# Patient Record
Sex: Female | Born: 1950
Health system: Southern US, Community
[De-identification: ages and names within clinical notes are randomized; demographics above are authoritative.]

## PROBLEM LIST (undated history)

## (undated) DIAGNOSIS — E119 Type 2 diabetes mellitus without complications: Secondary | ICD-10-CM

## (undated) DIAGNOSIS — I10 Essential (primary) hypertension: Secondary | ICD-10-CM

## (undated) DIAGNOSIS — Z87442 Personal history of urinary calculi: Secondary | ICD-10-CM

## (undated) HISTORY — PX: BREAST EXCISIONAL BIOPSY: SUR124

## (undated) HISTORY — PX: TONSILLECTOMY: SUR1361

## (undated) HISTORY — PX: EYE SURGERY: SHX253

---

## 2006-08-06 ENCOUNTER — Other Ambulatory Visit: Admission: RE | Admit: 2006-08-06 | Discharge: 2006-08-06 | Payer: Self-pay | Admitting: Family Medicine

## 2006-08-17 ENCOUNTER — Encounter: Admission: RE | Admit: 2006-08-17 | Discharge: 2006-08-17 | Payer: Self-pay | Admitting: Family Medicine

## 2007-08-24 ENCOUNTER — Encounter: Admission: RE | Admit: 2007-08-24 | Discharge: 2007-08-24 | Payer: Self-pay | Admitting: Family Medicine

## 2008-03-08 ENCOUNTER — Ambulatory Visit (HOSPITAL_BASED_OUTPATIENT_CLINIC_OR_DEPARTMENT_OTHER): Admission: RE | Admit: 2008-03-08 | Discharge: 2008-03-08 | Payer: Self-pay | Admitting: Urology

## 2008-08-24 ENCOUNTER — Encounter: Admission: RE | Admit: 2008-08-24 | Discharge: 2008-08-24 | Payer: Self-pay | Admitting: Family Medicine

## 2009-06-24 ENCOUNTER — Other Ambulatory Visit: Admission: RE | Admit: 2009-06-24 | Discharge: 2009-06-24 | Payer: Self-pay | Admitting: Family Medicine

## 2009-07-08 ENCOUNTER — Encounter: Admission: RE | Admit: 2009-07-08 | Discharge: 2009-07-08 | Payer: Self-pay | Admitting: Family Medicine

## 2009-08-28 ENCOUNTER — Encounter: Admission: RE | Admit: 2009-08-28 | Discharge: 2009-08-28 | Payer: Self-pay | Admitting: Family Medicine

## 2010-08-29 ENCOUNTER — Encounter
Admission: RE | Admit: 2010-08-29 | Discharge: 2010-08-29 | Payer: Self-pay | Source: Home / Self Care | Attending: Family Medicine | Admitting: Family Medicine

## 2010-12-23 NOTE — Op Note (Signed)
Kristy Montgomery, Kristy Montgomery                ACCOUNT NO.:  [id number]   MEDICAL RECORD NO.:  [id number]          PATIENT TYPE:  AMB   LOCATION:  [facility]                         FACILITY:  [facility]   PHYSICIAN:  Sigmund I. Patsi Sears, M.D.DATE OF BIRTH:  10-Jul-1951   DATE OF PROCEDURE:  03/08/2008  DATE OF DISCHARGE:                               OPERATIVE REPORT   PREOPERATIVE DIAGNOSIS:  Stress urinary incontinence.   POSTOPERATIVE DIAGNOSIS:  Stress urinary incontinence.   OPERATION:  Solyx transvaginal pubovaginal sling.   SURGEON:  Sigmund I. Patsi Sears, M.D.   ANESTHESIA:  General LMA.   PREPARATION:  After appropriate preanesthesia, the patient was brought  to the operating room and placed on the operating table in the dorsal  supine positive where general LMA anesthesia was introduced.  She was re-  placed in the dorsal lithotomy position where the pubis was prepped with  Betadine solution and draped in the usual fashion.   HISTORY:  This 60 year old female is post hysterectomy, complaints of  stress urinary incontinence.  She has a positive Q-tip test, positive  Marshall test, and no evidence of pelvic floor descent.  She presents  now for Solyx transvaginal sling.   DESCRIPTION OF PROCEDURE:  Vaginal inspection reveals the patient has  normal vaginal epithelium.  A posterior weighted speculum is placed, and  a Foley catheter is placed.  The bladder neck is easily palpable, and  Marcaine 0.25% with epinephrine 1:200,000 is injected in the  periurethral space.  A 1.5-cm incision is made in the midline of the  urethra, in the location of the mid urethra.  Subcutaneous tissue is  dissected, and using a marking pen, the obturator fossa is outlined.  Using the Solyx sling mesh, marking is placed halfway on the sling, and  the sling is first placed in the right obturator internus muscle without  difficulty.  It is placed at a 45-degree angle to the urethra, toward  the mark in the  obturator fossa, with more than half the sling placed in  this fashion.  The second portion of the sling is then placed in the  left obturator internus muscle, and visual inspection is accomplished.  After antibiotic irrigation, it shows that there is excellent  tensioning.  I am able to place a right-angle clamp behind the sling,  but the sling shows periurethral tissue pushing through the sling pores.  Following this, the wound is closed with running and interrupted 2-0  Vicryl suture.  Bleeding appeared from the vaginal epithelial edge and  stopped with suturing.  The patient tolerated the procedure well.  She  received a B&O suppository, as well as IV Toradol at the end of the  procedure.  She was awakened and taken to the recovery room in good  condition.      Sigmund I. Patsi Sears, M.D.  Electronically Signed    SIT/MEDQ  D:  03/08/2008  T:  03/08/2008  Job:  [id number]

## 2011-05-08 LAB — POCT I-STAT 4, (NA,K, GLUC, HGB,HCT)
HCT: 47 — ABNORMAL HIGH
Potassium: 4.1

## 2011-08-19 ENCOUNTER — Other Ambulatory Visit: Payer: Self-pay | Admitting: Family Medicine

## 2011-08-19 DIAGNOSIS — Z1231 Encounter for screening mammogram for malignant neoplasm of breast: Secondary | ICD-10-CM

## 2011-09-02 ENCOUNTER — Ambulatory Visit
Admission: RE | Admit: 2011-09-02 | Discharge: 2011-09-02 | Disposition: A | Discharge status: Home / Self Care | Payer: BC Managed Care – PPO | Source: Ambulatory Visit | Attending: Family Medicine | Admitting: Family Medicine

## 2011-09-02 DIAGNOSIS — Z1231 Encounter for screening mammogram for malignant neoplasm of breast: Secondary | ICD-10-CM

## 2012-08-09 ENCOUNTER — Other Ambulatory Visit: Payer: Self-pay | Admitting: Family Medicine

## 2012-08-09 DIAGNOSIS — Z1231 Encounter for screening mammogram for malignant neoplasm of breast: Secondary | ICD-10-CM

## 2012-09-13 ENCOUNTER — Ambulatory Visit
Admission: RE | Admit: 2012-09-13 | Discharge: 2012-09-13 | Disposition: A | Discharge status: Home / Self Care | Payer: BC Managed Care – PPO | Source: Ambulatory Visit | Attending: Family Medicine | Admitting: Family Medicine

## 2012-09-13 DIAGNOSIS — Z1231 Encounter for screening mammogram for malignant neoplasm of breast: Secondary | ICD-10-CM

## 2013-08-31 ENCOUNTER — Other Ambulatory Visit: Payer: Self-pay

## 2013-08-31 DIAGNOSIS — Z1231 Encounter for screening mammogram for malignant neoplasm of breast: Secondary | ICD-10-CM

## 2013-09-22 ENCOUNTER — Ambulatory Visit
Admission: RE | Admit: 2013-09-22 | Discharge: 2013-09-22 | Disposition: A | Discharge status: Home / Self Care | Payer: BC Managed Care – PPO | Source: Ambulatory Visit

## 2013-09-22 DIAGNOSIS — Z1231 Encounter for screening mammogram for malignant neoplasm of breast: Secondary | ICD-10-CM

## 2014-04-24 ENCOUNTER — Ambulatory Visit
Admission: RE | Admit: 2014-04-24 | Discharge: 2014-04-24 | Disposition: A | Discharge status: Home / Self Care | Payer: BC Managed Care – PPO | Source: Ambulatory Visit | Attending: Family Medicine | Admitting: Family Medicine

## 2014-04-24 ENCOUNTER — Other Ambulatory Visit: Payer: Self-pay | Admitting: Family Medicine

## 2014-04-24 DIAGNOSIS — R071 Chest pain on breathing: Secondary | ICD-10-CM

## 2014-09-03 ENCOUNTER — Other Ambulatory Visit: Payer: Self-pay

## 2014-09-03 DIAGNOSIS — Z1231 Encounter for screening mammogram for malignant neoplasm of breast: Secondary | ICD-10-CM

## 2014-09-24 ENCOUNTER — Ambulatory Visit: Payer: Self-pay

## 2014-10-04 ENCOUNTER — Ambulatory Visit
Admission: RE | Admit: 2014-10-04 | Discharge: 2014-10-04 | Disposition: A | Discharge status: Home / Self Care | Payer: 59 | Source: Ambulatory Visit

## 2014-10-04 DIAGNOSIS — Z1231 Encounter for screening mammogram for malignant neoplasm of breast: Secondary | ICD-10-CM

## 2015-08-30 ENCOUNTER — Other Ambulatory Visit: Payer: Self-pay

## 2015-08-30 DIAGNOSIS — Z1231 Encounter for screening mammogram for malignant neoplasm of breast: Secondary | ICD-10-CM

## 2015-09-30 ENCOUNTER — Other Ambulatory Visit: Payer: Self-pay | Admitting: Family Medicine

## 2015-09-30 DIAGNOSIS — N951 Menopausal and female climacteric states: Secondary | ICD-10-CM

## 2015-10-07 ENCOUNTER — Ambulatory Visit
Admission: RE | Admit: 2015-10-07 | Discharge: 2015-10-07 | Disposition: A | Discharge status: Home / Self Care | Payer: BLUE CROSS/BLUE SHIELD | Source: Ambulatory Visit

## 2015-10-07 DIAGNOSIS — Z1231 Encounter for screening mammogram for malignant neoplasm of breast: Secondary | ICD-10-CM

## 2015-10-18 ENCOUNTER — Other Ambulatory Visit: Payer: Self-pay

## 2015-10-31 ENCOUNTER — Ambulatory Visit
Admission: RE | Admit: 2015-10-31 | Discharge: 2015-10-31 | Disposition: A | Discharge status: Home / Self Care | Payer: BLUE CROSS/BLUE SHIELD | Source: Ambulatory Visit | Attending: Family Medicine | Admitting: Family Medicine

## 2015-10-31 DIAGNOSIS — N951 Menopausal and female climacteric states: Secondary | ICD-10-CM

## 2016-09-16 ENCOUNTER — Other Ambulatory Visit: Payer: Self-pay | Admitting: Family Medicine

## 2016-09-16 DIAGNOSIS — Z1231 Encounter for screening mammogram for malignant neoplasm of breast: Secondary | ICD-10-CM

## 2016-10-08 ENCOUNTER — Ambulatory Visit
Admission: RE | Admit: 2016-10-08 | Discharge: 2016-10-08 | Disposition: A | Discharge status: Home / Self Care | Payer: BLUE CROSS/BLUE SHIELD | Source: Ambulatory Visit | Attending: Family Medicine | Admitting: Family Medicine

## 2016-10-08 DIAGNOSIS — Z1231 Encounter for screening mammogram for malignant neoplasm of breast: Secondary | ICD-10-CM

## 2017-09-07 ENCOUNTER — Other Ambulatory Visit: Payer: Self-pay | Admitting: Family Medicine

## 2017-09-07 DIAGNOSIS — Z139 Encounter for screening, unspecified: Secondary | ICD-10-CM

## 2017-10-11 ENCOUNTER — Ambulatory Visit
Admission: RE | Admit: 2017-10-11 | Discharge: 2017-10-11 | Disposition: A | Discharge status: Home / Self Care | Payer: Self-pay | Source: Ambulatory Visit | Attending: Family Medicine | Admitting: Family Medicine

## 2017-10-11 DIAGNOSIS — Z139 Encounter for screening, unspecified: Secondary | ICD-10-CM

## 2017-10-11 DIAGNOSIS — Z1231 Encounter for screening mammogram for malignant neoplasm of breast: Secondary | ICD-10-CM | POA: Diagnosis not present

## 2017-10-21 DIAGNOSIS — J0110 Acute frontal sinusitis, unspecified: Secondary | ICD-10-CM | POA: Diagnosis not present

## 2017-10-21 DIAGNOSIS — E785 Hyperlipidemia, unspecified: Secondary | ICD-10-CM | POA: Diagnosis not present

## 2017-10-21 DIAGNOSIS — E1121 Type 2 diabetes mellitus with diabetic nephropathy: Secondary | ICD-10-CM | POA: Diagnosis not present

## 2017-10-21 DIAGNOSIS — N181 Chronic kidney disease, stage 1: Secondary | ICD-10-CM | POA: Diagnosis not present

## 2017-10-21 DIAGNOSIS — I129 Hypertensive chronic kidney disease with stage 1 through stage 4 chronic kidney disease, or unspecified chronic kidney disease: Secondary | ICD-10-CM | POA: Diagnosis not present

## 2017-10-28 DIAGNOSIS — Z23 Encounter for immunization: Secondary | ICD-10-CM | POA: Diagnosis not present

## 2017-12-23 DIAGNOSIS — H401132 Primary open-angle glaucoma, bilateral, moderate stage: Secondary | ICD-10-CM | POA: Diagnosis not present

## 2017-12-23 DIAGNOSIS — H04123 Dry eye syndrome of bilateral lacrimal glands: Secondary | ICD-10-CM | POA: Diagnosis not present

## 2018-01-21 DIAGNOSIS — I129 Hypertensive chronic kidney disease with stage 1 through stage 4 chronic kidney disease, or unspecified chronic kidney disease: Secondary | ICD-10-CM | POA: Diagnosis not present

## 2018-01-21 DIAGNOSIS — E785 Hyperlipidemia, unspecified: Secondary | ICD-10-CM | POA: Diagnosis not present

## 2018-01-21 DIAGNOSIS — E1121 Type 2 diabetes mellitus with diabetic nephropathy: Secondary | ICD-10-CM | POA: Diagnosis not present

## 2018-01-30 DIAGNOSIS — M545 Low back pain: Secondary | ICD-10-CM | POA: Diagnosis not present

## 2018-05-27 DIAGNOSIS — Z0000 Encounter for general adult medical examination without abnormal findings: Secondary | ICD-10-CM | POA: Diagnosis not present

## 2018-05-27 DIAGNOSIS — Z23 Encounter for immunization: Secondary | ICD-10-CM | POA: Diagnosis not present

## 2018-05-27 DIAGNOSIS — E559 Vitamin D deficiency, unspecified: Secondary | ICD-10-CM | POA: Diagnosis not present

## 2018-05-27 DIAGNOSIS — R809 Proteinuria, unspecified: Secondary | ICD-10-CM | POA: Diagnosis not present

## 2018-05-27 DIAGNOSIS — I129 Hypertensive chronic kidney disease with stage 1 through stage 4 chronic kidney disease, or unspecified chronic kidney disease: Secondary | ICD-10-CM | POA: Diagnosis not present

## 2018-05-27 DIAGNOSIS — E1121 Type 2 diabetes mellitus with diabetic nephropathy: Secondary | ICD-10-CM | POA: Diagnosis not present

## 2018-05-27 DIAGNOSIS — E785 Hyperlipidemia, unspecified: Secondary | ICD-10-CM | POA: Diagnosis not present

## 2018-05-27 DIAGNOSIS — N181 Chronic kidney disease, stage 1: Secondary | ICD-10-CM | POA: Diagnosis not present

## 2018-06-28 DIAGNOSIS — H401132 Primary open-angle glaucoma, bilateral, moderate stage: Secondary | ICD-10-CM | POA: Diagnosis not present

## 2018-06-28 DIAGNOSIS — H35371 Puckering of macula, right eye: Secondary | ICD-10-CM | POA: Diagnosis not present

## 2018-06-28 DIAGNOSIS — H04123 Dry eye syndrome of bilateral lacrimal glands: Secondary | ICD-10-CM | POA: Diagnosis not present

## 2018-07-10 DIAGNOSIS — R079 Chest pain, unspecified: Secondary | ICD-10-CM | POA: Diagnosis not present

## 2018-09-05 ENCOUNTER — Other Ambulatory Visit: Payer: Self-pay | Admitting: Family Medicine

## 2018-09-05 DIAGNOSIS — Z1231 Encounter for screening mammogram for malignant neoplasm of breast: Secondary | ICD-10-CM

## 2018-09-30 DIAGNOSIS — E1129 Type 2 diabetes mellitus with other diabetic kidney complication: Secondary | ICD-10-CM | POA: Diagnosis not present

## 2018-09-30 DIAGNOSIS — R809 Proteinuria, unspecified: Secondary | ICD-10-CM | POA: Diagnosis not present

## 2018-09-30 DIAGNOSIS — I129 Hypertensive chronic kidney disease with stage 1 through stage 4 chronic kidney disease, or unspecified chronic kidney disease: Secondary | ICD-10-CM | POA: Diagnosis not present

## 2018-09-30 DIAGNOSIS — N2581 Secondary hyperparathyroidism of renal origin: Secondary | ICD-10-CM | POA: Diagnosis not present

## 2018-10-04 ENCOUNTER — Other Ambulatory Visit: Payer: Self-pay | Admitting: Nephrology

## 2018-10-04 DIAGNOSIS — I129 Hypertensive chronic kidney disease with stage 1 through stage 4 chronic kidney disease, or unspecified chronic kidney disease: Secondary | ICD-10-CM

## 2018-10-04 DIAGNOSIS — R809 Proteinuria, unspecified: Secondary | ICD-10-CM

## 2018-10-07 ENCOUNTER — Ambulatory Visit
Admission: RE | Admit: 2018-10-07 | Discharge: 2018-10-07 | Disposition: A | Discharge status: Home / Self Care | Payer: PPO | Source: Ambulatory Visit | Attending: Nephrology | Admitting: Nephrology

## 2018-10-07 DIAGNOSIS — I129 Hypertensive chronic kidney disease with stage 1 through stage 4 chronic kidney disease, or unspecified chronic kidney disease: Secondary | ICD-10-CM

## 2018-10-07 DIAGNOSIS — N200 Calculus of kidney: Secondary | ICD-10-CM | POA: Diagnosis not present

## 2018-10-07 DIAGNOSIS — R809 Proteinuria, unspecified: Secondary | ICD-10-CM

## 2018-10-17 ENCOUNTER — Ambulatory Visit
Admission: RE | Admit: 2018-10-17 | Discharge: 2018-10-17 | Disposition: A | Discharge status: Home / Self Care | Payer: PPO | Source: Ambulatory Visit | Attending: Family Medicine | Admitting: Family Medicine

## 2018-10-17 DIAGNOSIS — Z1231 Encounter for screening mammogram for malignant neoplasm of breast: Secondary | ICD-10-CM

## 2018-11-25 DIAGNOSIS — I129 Hypertensive chronic kidney disease with stage 1 through stage 4 chronic kidney disease, or unspecified chronic kidney disease: Secondary | ICD-10-CM | POA: Diagnosis not present

## 2018-11-25 DIAGNOSIS — E785 Hyperlipidemia, unspecified: Secondary | ICD-10-CM | POA: Diagnosis not present

## 2018-11-25 DIAGNOSIS — E559 Vitamin D deficiency, unspecified: Secondary | ICD-10-CM | POA: Diagnosis not present

## 2018-11-25 DIAGNOSIS — E1121 Type 2 diabetes mellitus with diabetic nephropathy: Secondary | ICD-10-CM | POA: Diagnosis not present

## 2018-11-25 DIAGNOSIS — R509 Fever, unspecified: Secondary | ICD-10-CM | POA: Diagnosis not present

## 2018-11-25 DIAGNOSIS — N181 Chronic kidney disease, stage 1: Secondary | ICD-10-CM | POA: Diagnosis not present

## 2018-12-13 DIAGNOSIS — N2581 Secondary hyperparathyroidism of renal origin: Secondary | ICD-10-CM | POA: Diagnosis not present

## 2018-12-13 DIAGNOSIS — I129 Hypertensive chronic kidney disease with stage 1 through stage 4 chronic kidney disease, or unspecified chronic kidney disease: Secondary | ICD-10-CM | POA: Diagnosis not present

## 2018-12-22 DIAGNOSIS — I129 Hypertensive chronic kidney disease with stage 1 through stage 4 chronic kidney disease, or unspecified chronic kidney disease: Secondary | ICD-10-CM | POA: Diagnosis not present

## 2018-12-22 DIAGNOSIS — N2581 Secondary hyperparathyroidism of renal origin: Secondary | ICD-10-CM | POA: Diagnosis not present

## 2018-12-22 DIAGNOSIS — R809 Proteinuria, unspecified: Secondary | ICD-10-CM | POA: Diagnosis not present

## 2019-04-18 DIAGNOSIS — I129 Hypertensive chronic kidney disease with stage 1 through stage 4 chronic kidney disease, or unspecified chronic kidney disease: Secondary | ICD-10-CM | POA: Diagnosis not present

## 2019-04-18 DIAGNOSIS — N2581 Secondary hyperparathyroidism of renal origin: Secondary | ICD-10-CM | POA: Diagnosis not present

## 2019-04-24 DIAGNOSIS — I129 Hypertensive chronic kidney disease with stage 1 through stage 4 chronic kidney disease, or unspecified chronic kidney disease: Secondary | ICD-10-CM | POA: Diagnosis not present

## 2019-04-24 DIAGNOSIS — R809 Proteinuria, unspecified: Secondary | ICD-10-CM | POA: Diagnosis not present

## 2019-04-24 DIAGNOSIS — N2581 Secondary hyperparathyroidism of renal origin: Secondary | ICD-10-CM | POA: Diagnosis not present

## 2019-04-24 DIAGNOSIS — E1129 Type 2 diabetes mellitus with other diabetic kidney complication: Secondary | ICD-10-CM | POA: Diagnosis not present

## 2019-04-26 DIAGNOSIS — Z23 Encounter for immunization: Secondary | ICD-10-CM | POA: Diagnosis not present

## 2019-06-22 DIAGNOSIS — H35371 Puckering of macula, right eye: Secondary | ICD-10-CM | POA: Diagnosis not present

## 2019-06-22 DIAGNOSIS — H401132 Primary open-angle glaucoma, bilateral, moderate stage: Secondary | ICD-10-CM | POA: Diagnosis not present

## 2019-06-22 DIAGNOSIS — H04123 Dry eye syndrome of bilateral lacrimal glands: Secondary | ICD-10-CM | POA: Diagnosis not present

## 2019-07-23 DIAGNOSIS — R8281 Pyuria: Secondary | ICD-10-CM | POA: Diagnosis not present

## 2019-07-23 DIAGNOSIS — R109 Unspecified abdominal pain: Secondary | ICD-10-CM | POA: Diagnosis not present

## 2019-07-23 DIAGNOSIS — R310 Gross hematuria: Secondary | ICD-10-CM | POA: Diagnosis not present

## 2019-09-05 DIAGNOSIS — Z7984 Long term (current) use of oral hypoglycemic drugs: Secondary | ICD-10-CM | POA: Diagnosis not present

## 2019-09-05 DIAGNOSIS — E785 Hyperlipidemia, unspecified: Secondary | ICD-10-CM | POA: Diagnosis not present

## 2019-09-05 DIAGNOSIS — R8281 Pyuria: Secondary | ICD-10-CM | POA: Diagnosis not present

## 2019-09-05 DIAGNOSIS — N200 Calculus of kidney: Secondary | ICD-10-CM | POA: Diagnosis not present

## 2019-09-05 DIAGNOSIS — R300 Dysuria: Secondary | ICD-10-CM | POA: Diagnosis not present

## 2019-09-05 DIAGNOSIS — E119 Type 2 diabetes mellitus without complications: Secondary | ICD-10-CM | POA: Diagnosis not present

## 2019-09-06 ENCOUNTER — Ambulatory Visit
Admission: RE | Admit: 2019-09-06 | Discharge: 2019-09-06 | Disposition: A | Discharge status: Home / Self Care | Payer: PPO | Source: Ambulatory Visit | Attending: Physician Assistant | Admitting: Physician Assistant

## 2019-09-06 ENCOUNTER — Other Ambulatory Visit: Payer: Self-pay | Admitting: Physician Assistant

## 2019-09-06 ENCOUNTER — Other Ambulatory Visit: Payer: Self-pay

## 2019-09-06 DIAGNOSIS — N200 Calculus of kidney: Secondary | ICD-10-CM

## 2019-09-06 DIAGNOSIS — R109 Unspecified abdominal pain: Secondary | ICD-10-CM | POA: Diagnosis not present

## 2019-09-11 ENCOUNTER — Other Ambulatory Visit (HOSPITAL_COMMUNITY): Payer: Self-pay | Admitting: Urology

## 2019-09-11 ENCOUNTER — Other Ambulatory Visit: Payer: Self-pay | Admitting: Urology

## 2019-09-11 ENCOUNTER — Other Ambulatory Visit: Payer: Self-pay | Admitting: Family Medicine

## 2019-09-11 DIAGNOSIS — Z1231 Encounter for screening mammogram for malignant neoplasm of breast: Secondary | ICD-10-CM

## 2019-09-11 DIAGNOSIS — N3000 Acute cystitis without hematuria: Secondary | ICD-10-CM | POA: Diagnosis not present

## 2019-09-11 DIAGNOSIS — N200 Calculus of kidney: Secondary | ICD-10-CM | POA: Diagnosis not present

## 2019-09-11 DIAGNOSIS — N10 Acute tubulo-interstitial nephritis: Secondary | ICD-10-CM | POA: Diagnosis not present

## 2019-09-22 ENCOUNTER — Other Ambulatory Visit: Payer: Self-pay

## 2019-09-22 ENCOUNTER — Ambulatory Visit (HOSPITAL_COMMUNITY)
Admission: RE | Admit: 2019-09-22 | Discharge: 2019-09-22 | Disposition: A | Discharge status: Home / Self Care | Payer: PPO | Source: Ambulatory Visit | Attending: Urology | Admitting: Urology

## 2019-09-22 DIAGNOSIS — N200 Calculus of kidney: Secondary | ICD-10-CM | POA: Diagnosis not present

## 2019-09-22 DIAGNOSIS — R3129 Other microscopic hematuria: Secondary | ICD-10-CM | POA: Diagnosis not present

## 2019-09-22 MED ORDER — FUROSEMIDE 10 MG/ML IJ SOLN
INTRAMUSCULAR | Status: AC
  Filled 2019-09-22: qty 4

## 2019-09-22 MED ORDER — TECHNETIUM TC 99M MERTIATIDE
5.3500 | Freq: Once | INTRAVENOUS | Status: AC | PRN
  Administered 2019-09-22: 5.35 via INTRAVENOUS

## 2019-09-29 DIAGNOSIS — N200 Calculus of kidney: Secondary | ICD-10-CM | POA: Diagnosis not present

## 2019-10-04 DIAGNOSIS — H35371 Puckering of macula, right eye: Secondary | ICD-10-CM | POA: Diagnosis not present

## 2019-10-04 DIAGNOSIS — H04123 Dry eye syndrome of bilateral lacrimal glands: Secondary | ICD-10-CM | POA: Diagnosis not present

## 2019-10-04 DIAGNOSIS — H401132 Primary open-angle glaucoma, bilateral, moderate stage: Secondary | ICD-10-CM | POA: Diagnosis not present

## 2019-10-06 DIAGNOSIS — R8271 Bacteriuria: Secondary | ICD-10-CM | POA: Diagnosis not present

## 2019-10-06 DIAGNOSIS — N111 Chronic obstructive pyelonephritis: Secondary | ICD-10-CM | POA: Diagnosis not present

## 2019-10-11 ENCOUNTER — Other Ambulatory Visit: Payer: Self-pay | Admitting: Urology

## 2019-10-12 DIAGNOSIS — Z1159 Encounter for screening for other viral diseases: Secondary | ICD-10-CM | POA: Diagnosis not present

## 2019-10-13 ENCOUNTER — Ambulatory Visit: Payer: PPO | Attending: Internal Medicine

## 2019-10-13 DIAGNOSIS — Z23 Encounter for immunization: Secondary | ICD-10-CM | POA: Insufficient documentation

## 2019-10-13 NOTE — Progress Notes (Signed)
   Covid-19 Vaccination Clinic  Name:  Kristy Montgomery    MRN: [id number] DOB: 1950/09/11  10/13/2019  [name] was observed post Covid-19 immunization for 15 minutes without incident. She was provided with Vaccine Information Sheet and instruction to access the V-Safe system.   [name] was instructed to call 911 with any severe reactions post vaccine: Marland Kitchen Difficulty breathing  . Swelling of face and throat  . A fast heartbeat  . A bad rash all over body  . Dizziness and weakness   Immunizations Administered    Name Date Dose VIS Date Route   Pfizer COVID-19 Vaccine 10/13/2019  9:48 AM 0.3 mL 07/21/2019 Intramuscular   Manufacturer: [place]   Lot: [id number]   [place]: [id number]

## 2019-10-16 ENCOUNTER — Other Ambulatory Visit: Payer: Self-pay | Admitting: Urology

## 2019-10-17 DIAGNOSIS — Z1211 Encounter for screening for malignant neoplasm of colon: Secondary | ICD-10-CM | POA: Diagnosis not present

## 2019-10-17 DIAGNOSIS — K635 Polyp of colon: Secondary | ICD-10-CM | POA: Diagnosis not present

## 2019-10-20 ENCOUNTER — Ambulatory Visit
Admission: RE | Admit: 2019-10-20 | Discharge: 2019-10-20 | Disposition: A | Discharge status: Home / Self Care | Payer: PPO | Source: Ambulatory Visit | Attending: Family Medicine | Admitting: Family Medicine

## 2019-10-20 ENCOUNTER — Other Ambulatory Visit: Payer: Self-pay

## 2019-10-20 DIAGNOSIS — K635 Polyp of colon: Secondary | ICD-10-CM | POA: Diagnosis not present

## 2019-10-20 DIAGNOSIS — Z1231 Encounter for screening mammogram for malignant neoplasm of breast: Secondary | ICD-10-CM

## 2019-10-20 NOTE — Progress Notes (Signed)
PCP - Harlan Stains, MD Cardiologist -   Chest x-ray -  EKG -  Stress Test -  ECHO -  Cardiac Cath -   Sleep Study -  CPAP -   Fasting Blood Sugar -  Checks Blood Sugar _____ times a day  Blood Thinner Instructions: Aspirin Instructions: Last Dose:  Anesthesia review:   Patient denies shortness of breath, fever, cough and chest pain at PAT appointment   Patient verbalized understanding of instructions that were given to them at the PAT appointment. Patient was also instructed that they will need to review over the PAT instructions again at home before surgery.

## 2019-10-20 NOTE — Patient Instructions (Addendum)
DUE TO COVID-19 ONLY ONE VISITOR IS ALLOWED TO COME WITH YOU AND STAY IN THE WAITING ROOM ONLY DURING PRE OP AND PROCEDURE DAY OF SURGERY. THE 1 VISITOR MAY VISIT WITH YOU AFTER SURGERY IN YOUR PRIVATE ROOM DURING VISITING HOURS ONLY!  YOU NEED TO HAVE A COVID 19 TEST ON 10-27-19 @ 2:45 PM, THIS TEST MUST BE DONE BEFORE SURGERY, COME  [place], [place] , [id number].  ([place]) ONCE YOUR COVID TEST IS COMPLETED, PLEASE BEGIN THE QUARANTINE INSTRUCTIONS AS OUTLINED IN YOUR HANDOUT.                Kristy Montgomery  10/20/2019   Your procedure is scheduled on: 10-31-19   Report to [facility] Main  Entrance    Report to Short Stay at 5:30 AM     Call this number if you have problems the morning of surgery [phone]    Remember: Per your surgeon's instructions, please consume a Clear Liquid Diet the Day Before Your Surgery.   CLEAR LIQUID DIET   Foods Allowed                                                                     Foods Excluded  Coffee and tea, regular and decaf                             liquids that you cannot  Plain Jell-O any favor except red or purple                                           see through such as: Fruit ices (not with fruit pulp)                                     milk, soups, orange juice  Iced Popsicles                                    All solid food Carbonated beverages, regular and diet                                    Cranberry, grape and apple juices Sports drinks like Gatorade Lightly seasoned clear broth or consume(fat free) Sugar, honey syrup  Sample Menu Breakfast                                Lunch                                     Supper Cranberry juice                    Beef broth  Chicken broth Jell-O                                     Grape juice                           Apple juice Coffee or tea                        Jell-O                                       Popsicle                                                Coffee or tea                        Coffee or tea  _____________________________________________________________________       Take these medicines the morning of surgery with A SIP OF WATER: Amlodipine (Norvasc), Pravastatin, and Cetirizine (Zyrtec)  BRUSH YOUR TEETH MORNING OF SURGERY AND RINSE YOUR MOUTH OUT, NO CHEWING GUM CANDY OR MINTS.   DO NOT TAKE ANY DIABETIC MEDICATIONS DAY OF YOUR SURGERY                               You may not have any metal on your body including hair pins and              piercings     Do not wear jewelry, make-up, lotions, powders or perfumes, deodorant              Do not wear nail polish on your fingernails.  Do not shave  48 hours prior to surgery.              Do not bring valuables to the hospital. [place].  Contacts, dentures or bridgework may not be worn into surgery.  Leave suitcase in the car. After surgery it may be brought to your room.   Special Instructions: FOLLOW YOUR PREP, PER YOUR SURGEON'S INSTRUCTIONS              Please read over the following fact sheets you were given: _____________________________________________________________________  How to Manage Your Diabetes Before and After Surgery  Why is it important to control my blood sugar before and after surgery? . Improving blood sugar levels before and after surgery helps healing and can limit problems. . A way of improving blood sugar control is eating a healthy diet by: o  Eating less sugar and carbohydrates o  Increasing activity/exercise o  Talking with your doctor about reaching your blood sugar goals . High blood sugars (greater than 180 mg/dL) can raise your risk of infections and slow your recovery, so you will need to focus on controlling your diabetes during the weeks before surgery. . Make sure that the doctor who takes care of your diabetes knows  about your planned surgery including the  date and location.  How do I manage my blood sugar before surgery? . Check your blood sugar at least 4 times a day, starting 2 days before surgery, to make sure that the level is not too high or low. o Check your blood sugar the morning of your surgery when you wake up and every 2 hours until you get to the Short Stay unit. . If your blood sugar is less than 70 mg/dL, you will need to treat for low blood sugar: o Do not take insulin. o Treat a low blood sugar (less than 70 mg/dL) with  cup of clear juice (cranberry or apple), 4 glucose tablets, OR glucose gel. o Recheck blood sugar in 15 minutes after treatment (to make sure it is greater than 70 mg/dL). If your blood sugar is not greater than 70 mg/dL on recheck, call [phone] for further instructions. . Report your blood sugar to the short stay nurse when you get to Short Stay.  . If you are admitted to the hospital after surgery: o Your blood sugar will be checked by the staff and you will probably be given insulin after surgery (instead of oral diabetes medicines) to make sure you have good blood sugar levels. o The goal for blood sugar control after surgery is 80-180 mg/dL.   WHAT DO I DO ABOUT MY DIABETES MEDICATION?  Marland Kitchen Do not take oral diabetes medicines (pills) the morning of surgery.  . THE DAY BEFORE SURGERY, DONOT TAKE YOUR JARDIANCE .                  [facility] - Preparing for Surgery Before surgery, you can play an important role.  Because skin is not sterile, your skin needs to be as free of germs as possible.  You can reduce the number of germs on your skin by washing with CHG (chlorahexidine gluconate) soap before surgery.  CHG is an antiseptic cleaner which kills germs and bonds with the skin to continue killing germs even after washing. Please DO NOT use if you have an allergy to CHG or antibacterial soaps.  If your skin becomes reddened/irritated stop using the CHG and  inform your nurse when you arrive at Short Stay. Do not shave (including legs and underarms) for at least 48 hours prior to the first CHG shower.  You may shave your face/neck. Please follow these instructions carefully:  1.  Shower with CHG Soap the night before surgery and the  morning of Surgery.  2.  If you choose to wash your hair, wash your hair first as usual with your  normal  shampoo.  3.  After you shampoo, rinse your hair and body thoroughly to remove the  shampoo.                           4.  Use CHG as you would any other liquid soap.  You can apply chg directly  to the skin and wash                       Gently with a scrungie or clean washcloth.  5.  Apply the CHG Soap to your body ONLY FROM THE NECK DOWN.   Do not use on face/ open                           Wound or open sores. Avoid contact with  eyes, ears mouth and genitals (private parts).                       Wash face,  Genitals (private parts) with your normal soap.             6.  Wash thoroughly, paying special attention to the area where your surgery  will be performed.  7.  Thoroughly rinse your body with warm water from the neck down.  8.  DO NOT shower/wash with your normal soap after using and rinsing off  the CHG Soap.                9.  Pat yourself dry with a clean towel.            10.  Wear clean pajamas.            11.  Place clean sheets on your bed the night of your first shower and do not  sleep with pets. Day of Surgery : Do not apply any lotions/deodorants the morning of surgery.  Please wear clean clothes to the hospital/surgery center.  FAILURE TO FOLLOW THESE INSTRUCTIONS MAY RESULT IN THE CANCELLATION OF YOUR SURGERY PATIENT SIGNATURE_________________________________  NURSE SIGNATURE__________________________________  ________________________________________________________________________

## 2019-10-23 ENCOUNTER — Encounter (HOSPITAL_COMMUNITY): Payer: Self-pay

## 2019-10-23 ENCOUNTER — Encounter (HOSPITAL_COMMUNITY)
Admission: RE | Admit: 2019-10-23 | Discharge: 2019-10-23 | Disposition: A | Discharge status: Home / Self Care | Payer: PPO | Source: Ambulatory Visit | Attending: Urology | Admitting: Urology

## 2019-10-23 ENCOUNTER — Other Ambulatory Visit: Payer: Self-pay

## 2019-10-23 DIAGNOSIS — Z01812 Encounter for preprocedural laboratory examination: Secondary | ICD-10-CM | POA: Insufficient documentation

## 2019-10-23 DIAGNOSIS — Z01818 Encounter for other preprocedural examination: Secondary | ICD-10-CM | POA: Diagnosis not present

## 2019-10-23 DIAGNOSIS — R9431 Abnormal electrocardiogram [ECG] [EKG]: Secondary | ICD-10-CM | POA: Diagnosis not present

## 2019-10-23 HISTORY — DX: Essential (primary) hypertension: I10

## 2019-10-23 HISTORY — DX: Type 2 diabetes mellitus without complications: E11.9

## 2019-10-23 HISTORY — DX: Personal history of urinary calculi: Z87.442

## 2019-10-23 LAB — CBC
HCT: 38.5 % (ref 36.0–46.0)
Hemoglobin: 11.8 g/dL — ABNORMAL LOW (ref 12.0–15.0)
MCH: 26.8 pg (ref 26.0–34.0)
MCHC: 30.6 g/dL (ref 30.0–36.0)
MCV: 87.3 fL (ref 80.0–100.0)
Platelets: 343 10*3/uL (ref 150–400)
RBC: 4.41 MIL/uL (ref 3.87–5.11)
RDW: 17.2 % — ABNORMAL HIGH (ref 11.5–15.5)
WBC: 11.5 10*3/uL — ABNORMAL HIGH (ref 4.0–10.5)
nRBC: 0 % (ref 0.0–0.2)

## 2019-10-23 LAB — BASIC METABOLIC PANEL
Anion gap: 9 (ref 5–15)
BUN: 16 mg/dL (ref 8–23)
CO2: 27 mmol/L (ref 22–32)
Calcium: 9.8 mg/dL (ref 8.9–10.3)
Chloride: 105 mmol/L (ref 98–111)
Creatinine, Ser: 1.19 mg/dL — ABNORMAL HIGH (ref 0.44–1.00)
GFR calc Af Amer: 55 mL/min — ABNORMAL LOW (ref >60)
GFR calc non Af Amer: 47 mL/min — ABNORMAL LOW (ref >60)
Glucose, Bld: 151 mg/dL — ABNORMAL HIGH (ref 70–99)
Potassium: 4 mmol/L (ref 3.5–5.1)
Sodium: 141 mmol/L (ref 135–145)

## 2019-10-23 LAB — GLUCOSE, CAPILLARY: Glucose-Capillary: 114 mg/dL — ABNORMAL HIGH (ref 70–99)

## 2019-10-23 LAB — HEMOGLOBIN A1C
Hgb A1c MFr Bld: 6 % — ABNORMAL HIGH (ref 4.8–5.6)
Mean Plasma Glucose: 125.5 mg/dL

## 2019-10-27 ENCOUNTER — Other Ambulatory Visit (HOSPITAL_COMMUNITY)
Admission: RE | Admit: 2019-10-27 | Discharge: 2019-10-27 | Disposition: A | Discharge status: Home / Self Care | Payer: PPO | Source: Ambulatory Visit | Attending: Urology | Admitting: Urology

## 2019-10-27 DIAGNOSIS — Z01812 Encounter for preprocedural laboratory examination: Secondary | ICD-10-CM | POA: Diagnosis not present

## 2019-10-27 DIAGNOSIS — Z20822 Contact with and (suspected) exposure to covid-19: Secondary | ICD-10-CM | POA: Insufficient documentation

## 2019-10-27 LAB — SARS CORONAVIRUS 2 (TAT 6-24 HRS): SARS Coronavirus 2: NEGATIVE

## 2019-10-30 NOTE — Anesthesia Preprocedure Evaluation (Addendum)
Anesthesia Evaluation  Patient identified by MRN, date of birth, ID band Patient awake    Reviewed: Allergy & Precautions, NPO status , Patient's Chart, lab work & pertinent test results  History of Anesthesia Complications Negative for: history of anesthetic complications  Airway Mallampati: Trach  TM Distance: >3 FB Neck ROM: Full    Dental no notable dental hx. (+) Dental Advisory Given   Pulmonary Current Smoker,    Pulmonary exam normal        Cardiovascular hypertension, Pt. on medications Normal cardiovascular exam     Neuro/Psych negative neurological ROS     GI/Hepatic negative GI ROS, Neg liver ROS,   Endo/Other  diabetes  Renal/GU      Musculoskeletal negative musculoskeletal ROS (+)   Abdominal   Peds  Hematology negative hematology ROS (+)   Anesthesia Other Findings Day of surgery medications reviewed with the patient.  Reproductive/Obstetrics                            Anesthesia Physical Anesthesia Plan  ASA: II  Anesthesia Plan: General   Post-op Pain Management:    Induction: Intravenous  PONV Risk Score and Plan: 4 or greater and Ondansetron, Dexamethasone, Midazolam and Scopolamine patch - Pre-op  Airway Management Planned: Oral ETT  Additional Equipment:   Intra-op Plan:   Post-operative Plan: Extubation in OR  Informed Consent: I have reviewed the patients History and Physical, chart, labs and discussed the procedure including the risks, benefits and alternatives for the proposed anesthesia with the patient or authorized representative who has indicated his/her understanding and acceptance.     Dental advisory given  Plan Discussed with: Anesthesiologist and CRNA  Anesthesia Plan Comments:        Anesthesia Quick Evaluation

## 2019-10-30 NOTE — H&P (Signed)
CC/HPI: cc: left urolithiasis   10/06/19: 69 year old woman who developed left abdominal pain in December 2020. She then passed some purulence material with intermittent left-sided pain. A CT of the abdomen pelvis was done which showed an enlarged left kidney with perinephric edema with a 2 cm left renal pelvis stone as well as other renal calculi. Imaging is concerning for X GP. However patient is afebrile and not in any pain today. Her urine does appear consistent with infection today but she was given antibiotics recently. Lasix renogram showed no uptake from left kidney and overall function at 11%. CT abd/pelvis with contrast confirms chronically obstructed left kidney with large stones and findings consistent with XGP. Patient denies fever or chills. She does have intermittent left sided pain and passage of debris when she urinates that may be consistent with pus.     ALLERGIES: Metformin    MEDICATIONS: Zyrtec 10 mg tablet  Amlodipine Besylate  Fish Oil  Jardiance  Pravastatin Sodium  Valsartan  Vitamin D3     GU PSH: ESWL - 2009 Hysterectomy Unilat SO - 2009 Locm 300-399Mg /Ml Iodine,1Ml - 09/29/2019 Sling - 2009       PSH Notes: Vaginal Sling Operation For Stress Incontinence, Hysterectomy, Breast Surgery Lumpectomy, Tonsillectomy, Lithotripsy   NON-GU PSH: Remove Breast Lesion - 2009 Remove Tonsils - 2009     GU PMH: Acute Cystitis/UTI, Patient is on Bactrim however urine still appears infected. I will send urine for culture today. - 09/11/2019 Pyelonephritis - 09/11/2019 Renal calculus, I discussed the CT scan with the patient and the concern for possible XGP. Patient does not appear toxic and is not in any pain. I will obtain of CT of abdomen pelvis with contrast as well as a Lasix renogram to assess overall function. Based on the appearance of the kidney and contracted renal pelvis it may be that she needs a left simple nephrectomy. I recommended that the patient experience  sudden left flank pain, fevers/chills nausea vomiting that she go to the ER immediately. She will follow-up after imaging studies have been obtained. - 09/11/2019 Hematuria, Unspec, Hematuria - 2014 Stress Incontinence, Female stress incontinence - 2014      PMH Notes:  2008-03-22 13:33:40 - Note: Normal Routine History And Physical Adult   NON-GU PMH: Irritable bowel syndrome with diarrhea, Irritable Bowel Syndrome - 2014 Diabetes Type 2 Glaucoma Hypercholesterolemia Hypertension    FAMILY HISTORY: Cancer - Grandmother Diabetes - Brother, Runs In Family, Mother Hypertension - Father, Mother Kidney Stones - Mother Lung Cancer - Father   SOCIAL HISTORY: Marital Status: Widowed Preferred Language: English; Ethnicity: Not Hispanic Or Latino; Race: Black or African American Current Smoking Status: Patient smokes. Has smoked since 09/10/2001. Smokes less than 1/2 pack per day.   Tobacco Use Assessment Completed: Used Tobacco in last 30 days? Drinks 1 drink per month. Social Drinker.  Drinks 3 caffeinated drinks per day.     Notes: Occupation:, Alcohol Use, Caffeine Use, Tobacco Use, Marital History - Currently Married   REVIEW OF SYSTEMS:    GU Review Female:   Patient reports frequent urination. Patient denies hard to postpone urination, burning /pain with urination, get up at night to urinate, leakage of urine, stream starts and stops, trouble starting your stream, have to strain to urinate, and being pregnant.  Gastrointestinal (Upper):   Patient denies nausea, vomiting, and indigestion/ heartburn.  Gastrointestinal (Lower):   Patient denies diarrhea and constipation.  Constitutional:   Patient denies fever, night sweats, weight loss, and fatigue.  Skin:   Patient denies itching and skin rash/ lesion.  Eyes:   Patient denies blurred vision and double vision.  Ears/ Nose/ Throat:   Patient denies sore throat and sinus problems.  Hematologic/Lymphatic:   Patient denies swollen glands  and easy bruising.  Cardiovascular:   Patient denies leg swelling and chest pains.  Respiratory:   Patient denies cough and shortness of breath.  Endocrine:   Patient denies excessive thirst.  Musculoskeletal:   Patient denies back pain and joint pain.  Neurological:   Patient denies headaches and dizziness.  Psychologic:   Patient denies depression and anxiety.   VITAL SIGNS:      10/06/2019 11:32 AM  Weight 145 lb / 65.77 kg  Height 60 in / 152.4 cm  BP 124/82 mmHg  Pulse 88 /min  Temperature 97.0 F / 36.1 C  BMI 28.3 kg/m   GU PHYSICAL EXAMINATION:    Cervix: S/P Hysterectomy  Uterus: S/P Hysterectomy   MULTI-SYSTEM PHYSICAL EXAMINATION:    Constitutional: Well-nourished. No physical deformities. Normally developed. Good grooming.  Neck: Neck symmetrical, not swollen. Normal tracheal position.  Respiratory: No labored breathing, no use of accessory muscles.   Cardiovascular: Normal temperature, normal extremity pulses  Skin: No paleness, no jaundice, no cyanosis. No lesion, no ulcer, no rash.  Neurologic / Psychiatric: Oriented to time, oriented to place, oriented to person. No depression, no anxiety, no agitation.  Gastrointestinal: No mass, no tenderness, no rigidity, non obese abdomen. No CVA tenderness.  Eyes: Normal conjunctivae. Normal eyelids.  Ears, Nose, Mouth, and Throat: Left ear no scars, no lesions, no masses. Right ear no scars, no lesions, no masses. Nose no scars, no lesions, no masses. Normal hearing. Normal lips.  Musculoskeletal: Normal gait and station of head and neck.     PAST DATA REVIEWED:  Source Of History:  Patient  Records Review:   POC Tool  Urine Test Review:   Urinalysis  X-Ray Review: C.T. Abdomen/Pelvis: Reviewed Films. Discussed With Patient.  Lasix Renogram: Reviewed Films. Discussed With Patient.     PROCEDURES:          Urinalysis w/Scope Dipstick Dipstick Cont'd Micro  Color: Yellow Bilirubin: Neg mg/dL WBC/hpf: >60/hpf   Appearance: Cloudy Ketones: Neg mg/dL RBC/hpf: 10 - 20/hpf  Specific Gravity: 1.025 Blood: 3+ ery/uL Bacteria: Few (10-25/hpf)  pH: <=5.0 Protein: 2+ mg/dL Cystals: NS (Not Seen)  Glucose: 3+ mg/dL Urobilinogen: 0.2 mg/dL Casts: NS (Not Seen)    Nitrites: Neg Trichomonas: Not Present    Leukocyte Esterase: 3+ leu/uL Mucous: Not Present      Epithelial Cells: 0 - 5/hpf      Yeast: NS (Not Seen)      Sperm: Not Present    Notes: Microscopic not concentrated.    ASSESSMENT:      ICD-10 Details  1 GU:   Chronic obstructive pyelonephritis - N11.1 Undiagnosed New Problem - I discussed the findings on the CT abd/pelvis and lasix renogram which were both consistent with a chronically obstructed, non-functioning kidney. Given the risk this that leaving this chronically infected kidney poses to her overall health, I have recommend a hand-assisted lap left simple nephrectomy. We discussed the risks and benefits of this procedure including but not limited to bleeding (pt agreed that she would sign a blood consent and does not have contraindication to this), infection, damage to surrounding structures, pain, need for future intervention. We discussed postoperative care and recovery including a 2-3 night stay in hospital. I will also forward  these records to her nephrologist Dr. Posey Pronto. She does understand that her kidney function may slightly worsen however I do not feel like it will changed significantly due to the findings of the Lasix renogram. Patient will be scheduled for a nephrectomy in the next few weeks. With the patient develops fever, chills or severe left-sided flank pain she was instructed to present to the emergency room immediately as XGP can be life-threatening.   PLAN:           Orders Labs CULTURE, URINE          Document Letter(s):  Created for Patient: Clinical Summary         Notes:   cc: Dr. Harlan Stains  cc: Dr. Posey Pronto (nephrology)

## 2019-10-31 ENCOUNTER — Inpatient Hospital Stay (HOSPITAL_COMMUNITY): Payer: PPO | Admitting: Certified Registered Nurse Anesthetist

## 2019-10-31 ENCOUNTER — Inpatient Hospital Stay (HOSPITAL_COMMUNITY): Payer: PPO | Admitting: Physician Assistant

## 2019-10-31 ENCOUNTER — Encounter (HOSPITAL_COMMUNITY)
Admission: RE | Disposition: A | Discharge status: Home / Self Care | Payer: Self-pay | Source: Home / Self Care | Attending: Urology

## 2019-10-31 ENCOUNTER — Inpatient Hospital Stay (HOSPITAL_COMMUNITY)
Admission: RE | Admit: 2019-10-31 | Discharge: 2019-11-05 | DRG: 661 | Disposition: A | Discharge status: Home / Self Care | Payer: PPO | Attending: Urology | Admitting: Urology

## 2019-10-31 ENCOUNTER — Other Ambulatory Visit: Payer: Self-pay

## 2019-10-31 ENCOUNTER — Encounter (HOSPITAL_COMMUNITY): Payer: Self-pay | Admitting: Urology

## 2019-10-31 DIAGNOSIS — N118 Other chronic tubulo-interstitial nephritis: Secondary | ICD-10-CM | POA: Diagnosis present

## 2019-10-31 DIAGNOSIS — K580 Irritable bowel syndrome with diarrhea: Secondary | ICD-10-CM | POA: Diagnosis not present

## 2019-10-31 DIAGNOSIS — Z8249 Family history of ischemic heart disease and other diseases of the circulatory system: Secondary | ICD-10-CM | POA: Diagnosis not present

## 2019-10-31 DIAGNOSIS — E7800 Pure hypercholesterolemia, unspecified: Secondary | ICD-10-CM | POA: Diagnosis not present

## 2019-10-31 DIAGNOSIS — F17210 Nicotine dependence, cigarettes, uncomplicated: Secondary | ICD-10-CM | POA: Diagnosis not present

## 2019-10-31 DIAGNOSIS — H409 Unspecified glaucoma: Secondary | ICD-10-CM | POA: Diagnosis present

## 2019-10-31 DIAGNOSIS — K567 Ileus, unspecified: Secondary | ICD-10-CM

## 2019-10-31 DIAGNOSIS — N111 Chronic obstructive pyelonephritis: Principal | ICD-10-CM | POA: Diagnosis present

## 2019-10-31 DIAGNOSIS — Z801 Family history of malignant neoplasm of trachea, bronchus and lung: Secondary | ICD-10-CM | POA: Diagnosis not present

## 2019-10-31 DIAGNOSIS — N13729 Vesicoureteral-reflux with reflux nephropathy without hydroureter, unspecified: Secondary | ICD-10-CM | POA: Diagnosis present

## 2019-10-31 DIAGNOSIS — N059 Unspecified nephritic syndrome with unspecified morphologic changes: Secondary | ICD-10-CM | POA: Diagnosis not present

## 2019-10-31 DIAGNOSIS — Z833 Family history of diabetes mellitus: Secondary | ICD-10-CM | POA: Diagnosis not present

## 2019-10-31 DIAGNOSIS — I10 Essential (primary) hypertension: Secondary | ICD-10-CM | POA: Diagnosis present

## 2019-10-31 DIAGNOSIS — E119 Type 2 diabetes mellitus without complications: Secondary | ICD-10-CM | POA: Diagnosis not present

## 2019-10-31 DIAGNOSIS — N269 Renal sclerosis, unspecified: Secondary | ICD-10-CM | POA: Diagnosis not present

## 2019-10-31 HISTORY — PX: LAPAROSCOPIC NEPHRECTOMY, HAND ASSISTED: SHX1929

## 2019-10-31 LAB — GLUCOSE, CAPILLARY
Glucose-Capillary: 97 mg/dL (ref 70–99)
Glucose-Capillary: 168 mg/dL — ABNORMAL HIGH (ref 70–99)

## 2019-10-31 LAB — BASIC METABOLIC PANEL
Anion gap: 10 (ref 5–15)
BUN: 11 mg/dL (ref 8–23)
CO2: 23 mmol/L (ref 22–32)
Calcium: 8.8 mg/dL — ABNORMAL LOW (ref 8.9–10.3)
Chloride: 103 mmol/L (ref 98–111)
Creatinine, Ser: 0.91 mg/dL (ref 0.44–1.00)
GFR calc Af Amer: >60 mL/min (ref >60)
GFR calc non Af Amer: >60 mL/min (ref >60)
Glucose, Bld: 181 mg/dL — ABNORMAL HIGH (ref 70–99)
Potassium: 3.9 mmol/L (ref 3.5–5.1)
Sodium: 136 mmol/L (ref 135–145)

## 2019-10-31 LAB — HIV ANTIBODY (ROUTINE TESTING W REFLEX): HIV Screen 4th Generation wRfx: NONREACTIVE

## 2019-10-31 LAB — ABO/RH: ABO/RH(D): O POS

## 2019-10-31 LAB — HEMOGLOBIN AND HEMATOCRIT, BLOOD
HCT: 34.7 % — ABNORMAL LOW (ref 36.0–46.0)
Hemoglobin: 10.3 g/dL — ABNORMAL LOW (ref 12.0–15.0)

## 2019-10-31 SURGERY — NEPHRECTOMY, HAND-ASSISTED, LAPAROSCOPIC
Anesthesia: General | Laterality: Left

## 2019-10-31 MED ORDER — DEXTROSE-NACL 5-0.45 % IV SOLN: INTRAVENOUS | Status: DC

## 2019-10-31 MED ORDER — 0.9 % SODIUM CHLORIDE (POUR BTL) OPTIME
TOPICAL | Status: DC | PRN
  Administered 2019-10-31: 08:00:00 1000 mL

## 2019-10-31 MED ORDER — ALBUMIN HUMAN 5 % IV SOLN
INTRAVENOUS | Status: AC
  Filled 2019-10-31: qty 250

## 2019-10-31 MED ORDER — MIDAZOLAM HCL 5 MG/5ML IJ SOLN
INTRAMUSCULAR | Status: DC | PRN
  Administered 2019-10-31 (×2): 1 mg via INTRAVENOUS

## 2019-10-31 MED ORDER — ONDANSETRON HCL 4 MG/2ML IJ SOLN: 4.0000 mg | INTRAMUSCULAR | Status: DC | PRN

## 2019-10-31 MED ORDER — ALBUMIN HUMAN 5 % IV SOLN: INTRAVENOUS | Status: DC | PRN

## 2019-10-31 MED ORDER — LIDOCAINE 2% (20 MG/ML) 5 ML SYRINGE
INTRAMUSCULAR | Status: AC
  Filled 2019-10-31: qty 5

## 2019-10-31 MED ORDER — SUGAMMADEX SODIUM 500 MG/5ML IV SOLN
INTRAVENOUS | Status: AC
  Filled 2019-10-31: qty 5

## 2019-10-31 MED ORDER — ONDANSETRON HCL 4 MG/2ML IJ SOLN
INTRAMUSCULAR | Status: DC | PRN
  Administered 2019-10-31: 4 mg via INTRAVENOUS

## 2019-10-31 MED ORDER — FENTANYL CITRATE (PF) 100 MCG/2ML IJ SOLN
INTRAMUSCULAR | Status: AC
  Filled 2019-10-31: qty 4

## 2019-10-31 MED ORDER — BUPIVACAINE LIPOSOME 1.3 % IJ SUSP
20.0000 mL | Freq: Once | INTRAMUSCULAR | Status: AC
  Administered 2019-10-31: 10 mL
  Filled 2019-10-31: qty 20

## 2019-10-31 MED ORDER — IRBESARTAN 300 MG PO TABS
300.0000 mg | ORAL_TABLET | Freq: Every day | ORAL | Status: DC
  Administered 2019-10-31 – 2019-11-05 (×6): 300 mg via ORAL
  Filled 2019-10-31 (×6): qty 1

## 2019-10-31 MED ORDER — CHLORHEXIDINE GLUCONATE CLOTH 2 % EX PADS
6.0000 | MEDICATED_PAD | Freq: Every day | CUTANEOUS | Status: DC
  Administered 2019-10-31: 6 via TOPICAL

## 2019-10-31 MED ORDER — ROCURONIUM BROMIDE 50 MG/5ML IV SOSY
PREFILLED_SYRINGE | INTRAVENOUS | Status: DC | PRN
  Administered 2019-10-31 (×3): 20 mg via INTRAVENOUS
  Administered 2019-10-31: 60 mg via INTRAVENOUS

## 2019-10-31 MED ORDER — ROCURONIUM BROMIDE 10 MG/ML (PF) SYRINGE
PREFILLED_SYRINGE | INTRAVENOUS | Status: AC
  Filled 2019-10-31: qty 10

## 2019-10-31 MED ORDER — DIPHENHYDRAMINE HCL 50 MG/ML IJ SOLN: 12.5000 mg | Freq: Four times a day (QID) | INTRAMUSCULAR | Status: DC | PRN

## 2019-10-31 MED ORDER — SODIUM CHLORIDE 0.9% IV SOLUTION: Freq: Once | INTRAVENOUS | Status: DC

## 2019-10-31 MED ORDER — DOCUSATE SODIUM 100 MG PO CAPS
100.0000 mg | ORAL_CAPSULE | Freq: Two times a day (BID) | ORAL | Status: DC
  Administered 2019-10-31 – 2019-11-05 (×10): 100 mg via ORAL
  Filled 2019-10-31 (×10): qty 1

## 2019-10-31 MED ORDER — CEFAZOLIN SODIUM-DEXTROSE 2-4 GM/100ML-% IV SOLN
2.0000 g | INTRAVENOUS | Status: AC
  Administered 2019-10-31: 08:00:00 2 g via INTRAVENOUS
  Filled 2019-10-31: qty 100

## 2019-10-31 MED ORDER — PROPOFOL 10 MG/ML IV BOLUS
INTRAVENOUS | Status: AC
  Filled 2019-10-31: qty 20

## 2019-10-31 MED ORDER — LACTATED RINGERS IV SOLN: INTRAVENOUS | Status: DC | PRN

## 2019-10-31 MED ORDER — CELECOXIB 200 MG PO CAPS
200.0000 mg | ORAL_CAPSULE | Freq: Once | ORAL | Status: AC
  Administered 2019-10-31: 200 mg via ORAL
  Filled 2019-10-31: qty 1

## 2019-10-31 MED ORDER — CEFAZOLIN SODIUM-DEXTROSE 2-4 GM/100ML-% IV SOLN
2.0000 g | Freq: Three times a day (TID) | INTRAVENOUS | Status: AC
  Administered 2019-10-31 (×2): 2 g via INTRAVENOUS
  Filled 2019-10-31 (×2): qty 100

## 2019-10-31 MED ORDER — SODIUM CHLORIDE (PF) 0.9 % IJ SOLN
INTRAMUSCULAR | Status: AC
  Filled 2019-10-31: qty 20

## 2019-10-31 MED ORDER — PHENYLEPHRINE 40 MCG/ML (10ML) SYRINGE FOR IV PUSH (FOR BLOOD PRESSURE SUPPORT)
PREFILLED_SYRINGE | INTRAVENOUS | Status: AC
  Filled 2019-10-31: qty 20

## 2019-10-31 MED ORDER — AMLODIPINE BESYLATE 5 MG PO TABS
2.5000 mg | ORAL_TABLET | Freq: Every day | ORAL | Status: DC
  Administered 2019-11-01 – 2019-11-05 (×5): 2.5 mg via ORAL
  Filled 2019-10-31 (×5): qty 1

## 2019-10-31 MED ORDER — MAGNESIUM CITRATE PO SOLN
1.0000 | Freq: Once | ORAL | Status: DC
  Filled 2019-10-31: qty 296

## 2019-10-31 MED ORDER — FENTANYL CITRATE (PF) 250 MCG/5ML IJ SOLN
INTRAMUSCULAR | Status: DC | PRN
  Administered 2019-10-31: 100 ug via INTRAVENOUS
  Administered 2019-10-31 (×2): 25 ug via INTRAVENOUS
  Administered 2019-10-31 (×2): 50 ug via INTRAVENOUS

## 2019-10-31 MED ORDER — PHENYLEPHRINE 40 MCG/ML (10ML) SYRINGE FOR IV PUSH (FOR BLOOD PRESSURE SUPPORT)
PREFILLED_SYRINGE | INTRAVENOUS | Status: DC | PRN
  Administered 2019-10-31: 80 ug via INTRAVENOUS
  Administered 2019-10-31 (×2): 120 ug via INTRAVENOUS
  Administered 2019-10-31 (×2): 80 ug via INTRAVENOUS

## 2019-10-31 MED ORDER — LIP MEDEX EX OINT
TOPICAL_OINTMENT | CUTANEOUS | Status: DC | PRN
  Filled 2019-10-31: qty 7

## 2019-10-31 MED ORDER — LIDOCAINE 2% (20 MG/ML) 5 ML SYRINGE
INTRAMUSCULAR | Status: DC | PRN
  Administered 2019-10-31: 60 mg via INTRAVENOUS

## 2019-10-31 MED ORDER — ONDANSETRON HCL 4 MG/2ML IJ SOLN
INTRAMUSCULAR | Status: AC
  Filled 2019-10-31: qty 2

## 2019-10-31 MED ORDER — ACETAMINOPHEN 500 MG PO TABS
1000.0000 mg | ORAL_TABLET | Freq: Once | ORAL | Status: AC
  Administered 2019-10-31: 1000 mg via ORAL
  Filled 2019-10-31: qty 2

## 2019-10-31 MED ORDER — SCOPOLAMINE 1 MG/3DAYS TD PT72
1.0000 | MEDICATED_PATCH | TRANSDERMAL | Status: DC
  Administered 2019-10-31: 1.5 mg via TRANSDERMAL
  Filled 2019-10-31: qty 1

## 2019-10-31 MED ORDER — LACTATED RINGERS IR SOLN
Status: DC | PRN
  Administered 2019-10-31: 1000 mL

## 2019-10-31 MED ORDER — FENTANYL CITRATE (PF) 250 MCG/5ML IJ SOLN
INTRAMUSCULAR | Status: AC
  Filled 2019-10-31: qty 5

## 2019-10-31 MED ORDER — OXYCODONE HCL 5 MG PO TABS
5.0000 mg | ORAL_TABLET | ORAL | Status: DC | PRN
  Administered 2019-11-01 – 2019-11-05 (×9): 5 mg via ORAL
  Filled 2019-10-31 (×9): qty 1

## 2019-10-31 MED ORDER — DIPHENHYDRAMINE HCL 12.5 MG/5ML PO ELIX: 12.5000 mg | ORAL_SOLUTION | Freq: Four times a day (QID) | ORAL | Status: DC | PRN

## 2019-10-31 MED ORDER — FENTANYL CITRATE (PF) 100 MCG/2ML IJ SOLN
25.0000 ug | INTRAMUSCULAR | Status: DC | PRN
  Administered 2019-10-31 (×2): 50 ug via INTRAVENOUS

## 2019-10-31 MED ORDER — CANAGLIFLOZIN 100 MG PO TABS
100.0000 mg | ORAL_TABLET | Freq: Every day | ORAL | Status: DC
  Administered 2019-11-01 – 2019-11-05 (×5): 100 mg via ORAL
  Filled 2019-10-31 (×5): qty 1

## 2019-10-31 MED ORDER — PROPOFOL 10 MG/ML IV BOLUS
INTRAVENOUS | Status: DC | PRN
  Administered 2019-10-31: 180 mg via INTRAVENOUS

## 2019-10-31 MED ORDER — SUGAMMADEX SODIUM 500 MG/5ML IV SOLN
INTRAVENOUS | Status: DC | PRN
  Administered 2019-10-31: 250 mg via INTRAVENOUS

## 2019-10-31 MED ORDER — MORPHINE SULFATE (PF) 4 MG/ML IV SOLN
2.0000 mg | INTRAVENOUS | Status: DC | PRN
  Administered 2019-10-31 – 2019-11-01 (×5): 2 mg via INTRAVENOUS
  Administered 2019-11-04 (×2): 4 mg via INTRAVENOUS
  Filled 2019-10-31 (×7): qty 1

## 2019-10-31 MED ORDER — LORATADINE 10 MG PO TABS
10.0000 mg | ORAL_TABLET | Freq: Every day | ORAL | Status: DC
  Administered 2019-11-01 – 2019-11-05 (×5): 10 mg via ORAL
  Filled 2019-10-31 (×5): qty 1

## 2019-10-31 MED ORDER — SODIUM CHLORIDE 0.9% FLUSH
INTRAVENOUS | Status: DC | PRN
  Administered 2019-10-31: 10 mL

## 2019-10-31 MED ORDER — DEXAMETHASONE SODIUM PHOSPHATE 10 MG/ML IJ SOLN
INTRAMUSCULAR | Status: AC
  Filled 2019-10-31: qty 1

## 2019-10-31 MED ORDER — MIDAZOLAM HCL 2 MG/2ML IJ SOLN
INTRAMUSCULAR | Status: AC
  Filled 2019-10-31: qty 2

## 2019-10-31 MED ORDER — ACETAMINOPHEN 325 MG PO TABS
650.0000 mg | ORAL_TABLET | Freq: Four times a day (QID) | ORAL | Status: DC | PRN
  Administered 2019-11-01 – 2019-11-03 (×2): 650 mg via ORAL
  Filled 2019-10-31 (×2): qty 2

## 2019-10-31 MED ORDER — DEXAMETHASONE SODIUM PHOSPHATE 10 MG/ML IJ SOLN
INTRAMUSCULAR | Status: DC | PRN
  Administered 2019-10-31: 5 mg via INTRAVENOUS

## 2019-10-31 MED ORDER — PRAVASTATIN SODIUM 40 MG PO TABS
40.0000 mg | ORAL_TABLET | Freq: Every day | ORAL | Status: DC
  Administered 2019-11-01 – 2019-11-05 (×5): 40 mg via ORAL
  Filled 2019-10-31 (×5): qty 1

## 2019-10-31 MED ORDER — LACTATED RINGERS IV SOLN: INTRAVENOUS | Status: DC

## 2019-10-31 MED ORDER — OXYBUTYNIN CHLORIDE 5 MG PO TABS: 5.0000 mg | ORAL_TABLET | Freq: Three times a day (TID) | ORAL | Status: DC | PRN

## 2019-10-31 MED ORDER — PROMETHAZINE HCL 25 MG/ML IJ SOLN: 6.2500 mg | INTRAMUSCULAR | Status: DC | PRN

## 2019-10-31 SURGICAL SUPPLY — 70 items
ADH SKN CLS APL DERMABOND .7 (GAUZE/BANDAGES/DRESSINGS) ×1
AGENT HMST KT MTR STRL THRMB (HEMOSTASIS)
APL PRP STRL LF DISP 70% ISPRP (MISCELLANEOUS) ×1
APL SRG 38 LTWT LNG FL B (MISCELLANEOUS) ×1
APPLICATOR ARISTA FLEXITIP XL (MISCELLANEOUS) ×2 IMPLANT
BAG LAPAROSCOPIC 12 15 PORT 16 (BASKET) IMPLANT
BAG RETRIEVAL 12/15 (BASKET) ×2
BAG RETRIEVAL 12/15MM (BASKET) ×1
BAG SPEC RTRVL LRG 6X4 10 (ENDOMECHANICALS)
BAG SPEC THK2 15X12 ZIP CLS (MISCELLANEOUS) ×1
BAG ZIPLOCK 12X15 (MISCELLANEOUS) ×3 IMPLANT
BLADE EXTENDED COATED 6.5IN (ELECTRODE) IMPLANT
BLADE SURG SZ10 CARB STEEL (BLADE) IMPLANT
CABLE HIGH FREQUENCY MONO STRZ (ELECTRODE) ×3 IMPLANT
CHLORAPREP W/TINT 26 (MISCELLANEOUS) ×3 IMPLANT
CLEANER TIP ELECTROSURG 2X2 (MISCELLANEOUS) IMPLANT
CLIP VESOLOCK LG 6/CT PURPLE (CLIP) ×3 IMPLANT
CLIP VESOLOCK MED LG 6/CT (CLIP) IMPLANT
CLIP VESOLOCK XL 6/CT (CLIP) IMPLANT
COVER SURGICAL LIGHT HANDLE (MISCELLANEOUS) ×3 IMPLANT
COVER WAND RF STERILE (DRAPES) IMPLANT
CUTTER FLEX LINEAR 45M (STAPLE) IMPLANT
DECANTER SPIKE VIAL GLASS SM (MISCELLANEOUS) ×2 IMPLANT
DERMABOND ADVANCED (GAUZE/BANDAGES/DRESSINGS) ×2
DERMABOND ADVANCED .7 DNX12 (GAUZE/BANDAGES/DRESSINGS) IMPLANT
DRAIN CHANNEL 10F 3/8 F FF (DRAIN) IMPLANT
DRAPE INCISE IOBAN 66X45 STRL (DRAPES) ×3 IMPLANT
DRSG TEGADERM 4X4.75 (GAUZE/BANDAGES/DRESSINGS) IMPLANT
ELECT REM PT RETURN 15FT ADLT (MISCELLANEOUS) ×3 IMPLANT
EVACUATOR SILICONE 100CC (DRAIN) IMPLANT
GLOVE BIO SURGEON STRL SZ 6.5 (GLOVE) ×2 IMPLANT
GLOVE BIO SURGEON STRL SZ7.5 (GLOVE) ×3 IMPLANT
GLOVE BIO SURGEONS STRL SZ 6.5 (GLOVE) ×1
GOWN STRL REUS W/TWL LRG LVL3 (GOWN DISPOSABLE) ×6 IMPLANT
GOWN STRL REUS W/TWL XL LVL3 (GOWN DISPOSABLE) ×3 IMPLANT
HANDLE STAPLE EGIA 4 XL (STAPLE) ×2 IMPLANT
HEMOSTAT ARISTA ABSORB 3G PWDR (HEMOSTASIS) ×2 IMPLANT
HEMOSTAT SURGICEL 4X8 (HEMOSTASIS) ×4 IMPLANT
IRRIG SUCT STRYKERFLOW 2 WTIP (MISCELLANEOUS)
IRRIGATION SUCT STRKRFLW 2 WTP (MISCELLANEOUS) IMPLANT
KIT BASIN OR (CUSTOM PROCEDURE TRAY) ×3 IMPLANT
KIT TURNOVER KIT A (KITS) IMPLANT
LIGASURE VESSEL 5MM BLUNT TIP (ELECTROSURGICAL) ×2 IMPLANT
MARKER SKIN SURG 5.25 VIO NS (MISCELLANEOUS) ×3 IMPLANT
POUCH SPECIMEN RETRIEVAL 10MM (ENDOMECHANICALS) IMPLANT
RELOAD 45 VASCULAR/THIN (ENDOMECHANICALS) IMPLANT
RELOAD EGIA 45 TAN VASC (STAPLE) ×8 IMPLANT
RELOAD STAPLE 45 2.5 WHT GRN (ENDOMECHANICALS) IMPLANT
SCISSORS LAP 5X35 DISP (ENDOMECHANICALS) IMPLANT
SET TUBE SMOKE EVAC HIGH FLOW (TUBING) ×3 IMPLANT
SPONGE LAP 18X18 RF (DISPOSABLE) ×4 IMPLANT
STAPLER VISISTAT 35W (STAPLE) IMPLANT
SURGIFLO W/THROMBIN 8M KIT (HEMOSTASIS) IMPLANT
SUT ETHILON 3 0 PS 1 (SUTURE) IMPLANT
SUT MNCRL AB 4-0 PS2 18 (SUTURE) ×4 IMPLANT
SUT PDS AB 0 CTX 60 (SUTURE) ×1 IMPLANT
SUT PDS AB 1 TP1 96 (SUTURE) ×6 IMPLANT
SUT VIC AB 2-0 SH 27 (SUTURE) ×3
SUT VIC AB 2-0 SH 27X BRD (SUTURE) IMPLANT
SUT VICRYL 0 UR6 27IN ABS (SUTURE) IMPLANT
SYS LAPSCP GELPORT 120MM (MISCELLANEOUS) ×3
SYSTEM LAPSCP GELPORT 120MM (MISCELLANEOUS) ×1 IMPLANT
TOWEL OR 17X26 10 PK STRL BLUE (TOWEL DISPOSABLE) ×4 IMPLANT
TRAY FOLEY MTR SLVR 14FR STAT (SET/KITS/TRAYS/PACK) ×2 IMPLANT
TRAY FOLEY MTR SLVR 16FR STAT (SET/KITS/TRAYS/PACK) ×1 IMPLANT
TRAY LAPAROSCOPIC (CUSTOM PROCEDURE TRAY) ×3 IMPLANT
TROCAR BLADELESS OPT 5 100 (ENDOMECHANICALS) IMPLANT
TROCAR UNIVERSAL OPT 12M 100M (ENDOMECHANICALS) ×3 IMPLANT
TROCAR XCEL 12X100 BLDLESS (ENDOMECHANICALS) ×3 IMPLANT
YANKAUER SUCT BULB TIP 10FT TU (MISCELLANEOUS) ×3 IMPLANT

## 2019-10-31 NOTE — Discharge Instructions (Signed)

## 2019-10-31 NOTE — Anesthesia Procedure Notes (Signed)
Procedure Name: Intubation Performed by: West Pugh, CRNA Pre-anesthesia Checklist: Patient identified, Emergency Drugs available, Suction available, Patient being monitored and Timeout performed Patient Re-evaluated:Patient Re-evaluated prior to induction Oxygen Delivery Method: Circle system utilized Preoxygenation: Pre-oxygenation with 100% oxygen Induction Type: IV induction Ventilation: Mask ventilation without difficulty Laryngoscope Size: Miller and 2 Grade View: Grade II Tube type: Oral Tube size: 7.0 mm Number of attempts: 2 Airway Equipment and Method: Stylet Placement Confirmation: ETT inserted through vocal cords under direct vision,  positive ETCO2,  CO2 detector and breath sounds checked- equal and bilateral Secured at: 22 cm Tube secured with: Tape Dental Injury: Teeth and Oropharynx as per pre-operative assessment  Difficulty Due To: Difficult Airway- due to anterior larynx Comments: CRNA attempted x 2 without success. Grade 3 with MAC #3. Dr Tobias Alexander DL x1 with success with Sabra Heck 2. Anterior larynx

## 2019-10-31 NOTE — Plan of Care (Signed)
  Problem: Education: Goal: Knowledge of General Education information will improve Description: Including pain rating scale, medication(s)/side effects and non-pharmacologic comfort measures Outcome: Progressing   Problem: Health Behavior/Discharge Planning: Goal: Ability to manage health-related needs will improve Outcome: Progressing   Problem: Clinical Measurements: Goal: Ability to maintain clinical measurements within normal limits will improve Outcome: Progressing Goal: Will remain free from infection Outcome: Progressing Goal: Diagnostic test results will improve Outcome: Progressing Goal: Respiratory complications will improve Outcome: Progressing Goal: Cardiovascular complication will be avoided Outcome: Progressing   Problem: Activity: Goal: Risk for activity intolerance will decrease Outcome: Progressing   Problem: Nutrition: Goal: Adequate nutrition will be maintained Outcome: Progressing   Problem: Coping: Goal: Level of anxiety will decrease Outcome: Progressing   Problem: Pain Managment: Goal: General experience of comfort will improve Outcome: Progressing   Problem: Safety: Goal: Ability to remain free from injury will improve Outcome: Progressing   Problem: Skin Integrity: Goal: Risk for impaired skin integrity will decrease Outcome: Progressing   Problem: Clinical Measurements: Goal: Ability to maintain clinical measurements within normal limits will improve Outcome: Progressing Goal: Postoperative complications will be avoided or minimized Outcome: Progressing   Problem: Skin Integrity: Goal: Demonstration of wound healing without infection will improve Outcome: Progressing

## 2019-10-31 NOTE — Transfer of Care (Signed)
Immediate Anesthesia Transfer of Care Note  Patient: [facility]  Procedure(s) Performed: HAND ASSISTED LAPAROSCOPIC NEPHRECTOMY (Left )  Patient Location: PACU  Anesthesia Type:General  Level of Consciousness: awake, alert  and patient cooperative  Airway & Oxygen Therapy: Patient Spontanous Breathing and Patient connected to face mask oxygen  Post-op Assessment: Report given to RN and Post -op Vital signs reviewed and stable  Post vital signs: Reviewed and stable  Last Vitals:  Vitals Value Taken Time  BP 141/69 10/31/19 1102  Temp 36.4 C 10/31/19 1102  Pulse 81 10/31/19 1107  Resp 17 10/31/19 1107  SpO2 100 % 10/31/19 1107  Vitals shown include unvalidated device data.  Last Pain:  Vitals:   10/31/19 1102  TempSrc:   PainSc: Asleep         Complications: No apparent anesthesia complications

## 2019-10-31 NOTE — Op Note (Signed)
Operative Note  Preoperative diagnosis:  1.  Left xanthogranulomatous pyelonephritis  Postoperative diagnosis: 1.  Left xanthogranulomatous pyelonephritis  Procedure(s): 1.  Left hand assisted laparoscopic simple nephrectomy  Surgeon: Jacalyn Lefevre, MD  Assistants: Alen Blew, MD; An assistant was needed throughout the case further expertise in assisting with a laparoscopic surgery, including visualization with the camera, passing instruments, etc.  Anesthesia: General  Complications: None  EBL: 100 cc  Specimens: 1. Kidney  Drains/Catheters: 1. Foley catheter  Intraoperative findings:  1. Significant inflammation of kidney; kidney removed entirely  Indication: 69 year old woman who presented with left flank pain and intermittent passing of pus when voiding found to have nonfunctioning left kidney and findings of xanthogranulomatous pyelonephritis on imaging.  After discussion of different options, the patient elected to undergo the above operation.  Description of procedure:  The patient was identified and consent was obtained.  The patient was taken to the operating room and placed in the supine position.  The patient was placed under general anesthesia.  Perioperative antibiotics were administered.  The patient was placed in left lateral position at approximately 65 degrees and all pressure points were padded.  Patient was prepped and draped in a standard sterile fashion and a timeout was performed.  An 8 cm periumbilical incision was made sharply into the skin.  This was carried down with Bovie electrocautery down to the anterior rectus sheath which was divided with electrocautery.  The underlying musculature was separated in the midline.  Sharp dissection with Metzenbaum scissors was used to open up the posterior sheath and peritoneum.  This was extended with electrocautery taking great care not to use cautery near the bowel.  The hand assist port was secured into the  incision.  I made sure no bowel was trapped within this.  A 12 mm port was inserted through the hand assist port and the abdomen was insufflated to a pressure of 15.  A 12 mm port was placed lateral as well as superior to the hand assist port, each 1 about a hand width away.  Please note that all ports were placed under direct visualization with the camera.  The colon was first dissected medially by incising along the white line of Toldt.  After medializing the colon, the kidney was dissected laterally and medially as well as superiorly.  Inferior attachments as well as the ureter and gonadal vein were clipped and then divided with the ligasure.  Dissection continued medially up towards the renal hilum however there was significant inflammatory tissue at the hilum.  Lateral and superior attachments were then divided with the LigaSure.  There was minimal perirenal fat and a small tear was noted in the capsule.  Dissection continued until the renal hilum was identified.  The renal vein and renal artery were divided with a 45 mm vascular staple load.  Superior attachments were then released using LigaSure device as well as blunt dissection.  Once the entire kidney and surrounding Gerota's fascia was freed, the specimen was withdrawn from the midline incision and passed off for permanent specimen.  The abdomen was reinspected and no active bleeding was noted.  Surgicel and arista were applied to the nephrectomy bed.  The midline fascia was closed with running 0 looped PDS suture followed by vicryl and monocryle.  The port incisions were closed with a 4-0 Vicryl followed by monocryl.  Exparel was instilled for anesthetic effect.  Dermabond was applied.  A dressing was applied.  Patient tolerated the procedure well and was stable  postoperatively.  Plan: Stat labs will be obtained.  Anticipate the patient will be in the hospital 1-2 nights as long as he does well.

## 2019-10-31 NOTE — Interval H&P Note (Signed)
History and Physical Interval Note:  10/31/2019 7:09 AM  Kristy Montgomery  has presented today for surgery, with the diagnosis of XANTHOGRANULOMATOUS PYELONEPHRITIS.  The various methods of treatment have been discussed with the patient and family. After consideration of risks, benefits and other options for treatment, the patient has consented to  Procedure(s) with comments: HAND ASSISTED LAPAROSCOPIC NEPHRECTOMY (Left) - 4 HRS as a surgical intervention.  The patient's history has been reviewed, patient examined, no change in status, stable for surgery.  I have reviewed the patient's chart and labs.  Questions were answered to the patient's satisfaction.     Julen Rubert D Marbeth Smedley

## 2019-10-31 NOTE — Anesthesia Postprocedure Evaluation (Signed)
Anesthesia Post Note  Patient: Kristy Montgomery  Procedure(s) Performed: HAND ASSISTED LAPAROSCOPIC NEPHRECTOMY (Left )     Patient location during evaluation: PACU Anesthesia Type: General Level of consciousness: sedated Pain management: pain level controlled Vital Signs Assessment: post-procedure vital signs reviewed and stable Respiratory status: spontaneous breathing and respiratory function stable Cardiovascular status: stable Postop Assessment: no apparent nausea or vomiting Anesthetic complications: no    Last Vitals:  Vitals:   10/31/19 1145 10/31/19 1229  BP: 120/76 132/75  Pulse: 81 76  Resp: 14   Temp: 36.4 C 36.7 C  SpO2: 92% 94%    Last Pain:  Vitals:   10/31/19 1229  TempSrc: Oral  PainSc:                  Margarit Minshall DANIEL

## 2019-11-01 LAB — BASIC METABOLIC PANEL
Anion gap: 10 (ref 5–15)
BUN: 9 mg/dL (ref 8–23)
CO2: 25 mmol/L (ref 22–32)
Calcium: 9.2 mg/dL (ref 8.9–10.3)
Chloride: 103 mmol/L (ref 98–111)
Creatinine, Ser: 0.85 mg/dL (ref 0.44–1.00)
GFR calc Af Amer: >60 mL/min (ref >60)
GFR calc non Af Amer: >60 mL/min (ref >60)
Glucose, Bld: 163 mg/dL — ABNORMAL HIGH (ref 70–99)
Potassium: 4.2 mmol/L (ref 3.5–5.1)
Sodium: 138 mmol/L (ref 135–145)

## 2019-11-01 LAB — CBC
HCT: 31.5 % — ABNORMAL LOW (ref 36.0–46.0)
Hemoglobin: 9.6 g/dL — ABNORMAL LOW (ref 12.0–15.0)
MCH: 26.5 pg (ref 26.0–34.0)
MCHC: 30.5 g/dL (ref 30.0–36.0)
MCV: 87 fL (ref 80.0–100.0)
Platelets: 333 10*3/uL (ref 150–400)
RBC: 3.62 MIL/uL — ABNORMAL LOW (ref 3.87–5.11)
RDW: 16.4 % — ABNORMAL HIGH (ref 11.5–15.5)
WBC: 11.1 10*3/uL — ABNORMAL HIGH (ref 4.0–10.5)
nRBC: 0 % (ref 0.0–0.2)

## 2019-11-01 NOTE — Progress Notes (Signed)
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  Patient ambulated last night after surgery and sat in chair this AM. Tolerating clears but is burping some.  She does have moderate pain that responds to pain medication.   Active Problems:   Chronic non obstructive atrophic pyelonephritis  Current Facility-Administered Medications  Medication Dose Route Frequency Provider Last Rate Last Admin  . 0.9 %  sodium chloride infusion (Manually program via Guardrails IV Fluids)   Intravenous Once West Pugh, CRNA      . acetaminophen (TYLENOL) tablet 650 mg  650 mg Oral Q6H PRN Jacalyn Lefevre D, MD      . amLODipine (NORVASC) tablet 2.5 mg  2.5 mg Oral Daily Fredi Geiler, Simone Curia D, MD      . canagliflozin [facility]) tablet 100 mg  100 mg Oral QAC breakfast Jacalyn Lefevre D, MD      . Chlorhexidine Gluconate Cloth 2 % PADS 6 each  6 each Topical Daily Robley Fries, MD   6 each at 10/31/19 2204  . dextrose 5 %-0.45 % sodium chloride infusion   Intravenous Continuous Jacalyn Lefevre D, MD 75 mL/hr at 11/01/19 0600 Rate Verify at 11/01/19 0600  . diphenhydrAMINE (BENADRYL) injection 12.5 mg  12.5 mg Intravenous Q6H PRN Jacalyn Lefevre D, MD       Or  . diphenhydrAMINE (BENADRYL) 12.5 MG/5ML elixir 12.5 mg  12.5 mg Oral Q6H PRN Jacalyn Lefevre D, MD      . docusate sodium (COLACE) capsule 100 mg  100 mg Oral BID Jacalyn Lefevre D, MD   100 mg at 10/31/19 2110  . irbesartan (AVAPRO) tablet 300 mg  300 mg Oral Daily Jacalyn Lefevre D, MD   300 mg at 10/31/19 1718  . lactated ringers infusion   Intravenous Continuous Myrtie Soman, MD   Stopped at 10/31/19 2108  . lip balm (CARMEX) ointment   Topical PRN Jacalyn Lefevre D, MD      . loratadine (CLARITIN) tablet 10 mg  10 mg Oral Daily Tashala Cumbo, Simone Curia D, MD      . morphine 4 MG/ML injection 2-4  mg  2-4 mg Intravenous Q2H PRN Jacalyn Lefevre D, MD   2 mg at 11/01/19 0505  . ondansetron (ZOFRAN) injection 4 mg  4 mg Intravenous Q4H PRN Jacalyn Lefevre D, MD      . oxybutynin (DITROPAN) tablet 5 mg  5 mg Oral Q8H PRN Jed Limerick, NP      . oxyCODONE (Oxy IR/ROXICODONE) immediate release tablet 5 mg  5 mg Oral Q4H PRN Jacalyn Lefevre D, MD      . pravastatin (PRAVACHOL) tablet 40 mg  40 mg Oral Daily Jacalyn Lefevre D, MD         Objective: Vital: Vitals:   10/31/19 1233 10/31/19 1741 10/31/19 2115 11/01/19 0430  BP:  (!) 151/85 125/72 123/69  Pulse:  74 72 66  Resp:  18 19 17   Temp:  (!) 97.3 F (36.3 C) 98.2 F (36.8 C) 98.1 F (36.7 C)  TempSrc:  Oral Oral Oral  SpO2:  98% 96% 97%  Weight: 67.5 kg     Height: 5' (1.524 m)      I/Os: I/O last 3 completed shifts: In: 3279.2 [I.V.:2729.2; IV Piggyback:550] Out: 2300 [Urine:2100; Blood:200]  Physical Exam:  General: Patient is in no apparent distress Lungs: Normal respiratory effort, chest expands symmetrically.  GI: Incisions are c/d/i.  The abdomen is soft, appropriately ttp, non-distended Foley: draining clear yellow urine Ext: lower extremities symmetric  Lab Results: Recent Labs    10/31/19 1116 11/01/19 0453  WBC  --  11.1*  HGB 10.3* 9.6*  HCT 34.7* 31.5*   Recent Labs    10/31/19 1116 11/01/19 0453  NA 136 138  K 3.9 4.2  CL 103 103  CO2 23 25  GLUCOSE 181* 163*  BUN 11 9  CREATININE 0.91 0.85  CALCIUM 8.8* 9.2    Assessment:  69 yo woman with non-functioning left XGP kidney POD1 s/p HAND ASSISTED LAPAROSCOPIC NEPHRECTOMY, 1 Day Post-Op  doing well.  Kidney function unchanged after surgery.    Plan: -continue clear liquid diet until flatus  -SCDs while in bed -ambulate x 3 today in halls -pain meds PRN -bowel regimen -AM cbc -home medications -encourage incentive spirometry -d/c foley    Jacalyn Lefevre, MD Urology 11/01/2019, 9:23 AM

## 2019-11-01 NOTE — Plan of Care (Signed)
  Problem: Clinical Measurements: Goal: Will remain free from infection Outcome: Progressing Goal: Respiratory complications will improve Outcome: Progressing Goal: Cardiovascular complication will be avoided Outcome: Progressing   Goal: Will not experience complications related to urinary retention Outcome: Progressing

## 2019-11-02 LAB — CBC WITH DIFFERENTIAL/PLATELET
Abs Immature Granulocytes: 0.05 10*3/uL (ref 0.00–0.07)
Basophils Absolute: 0 10*3/uL (ref 0.0–0.1)
Basophils Relative: 0 %
Eosinophils Absolute: 0.1 10*3/uL (ref 0.0–0.5)
Eosinophils Relative: 1 %
HCT: 31.9 % — ABNORMAL LOW (ref 36.0–46.0)
Hemoglobin: 10 g/dL — ABNORMAL LOW (ref 12.0–15.0)
Immature Granulocytes: 1 %
Lymphocytes Relative: 16 %
Lymphs Abs: 1.5 10*3/uL (ref 0.7–4.0)
MCH: 26.7 pg (ref 26.0–34.0)
MCHC: 31.3 g/dL (ref 30.0–36.0)
MCV: 85.3 fL (ref 80.0–100.0)
Monocytes Absolute: 0.7 10*3/uL (ref 0.1–1.0)
Monocytes Relative: 8 %
Neutro Abs: 6.7 10*3/uL (ref 1.7–7.7)
Neutrophils Relative %: 74 %
Platelets: 328 10*3/uL (ref 150–400)
RBC: 3.74 MIL/uL — ABNORMAL LOW (ref 3.87–5.11)
RDW: 16.5 % — ABNORMAL HIGH (ref 11.5–15.5)
WBC: 9.1 10*3/uL (ref 4.0–10.5)
nRBC: 0 % (ref 0.0–0.2)

## 2019-11-02 LAB — SURGICAL PATHOLOGY

## 2019-11-02 LAB — BASIC METABOLIC PANEL
Anion gap: 9 (ref 5–15)
BUN: 6 mg/dL — ABNORMAL LOW (ref 8–23)
CO2: 24 mmol/L (ref 22–32)
Calcium: 9 mg/dL (ref 8.9–10.3)
Chloride: 106 mmol/L (ref 98–111)
Creatinine, Ser: 0.83 mg/dL (ref 0.44–1.00)
GFR calc Af Amer: >60 mL/min (ref >60)
GFR calc non Af Amer: >60 mL/min (ref >60)
Glucose, Bld: 108 mg/dL — ABNORMAL HIGH (ref 70–99)
Potassium: 4 mmol/L (ref 3.5–5.1)
Sodium: 139 mmol/L (ref 135–145)

## 2019-11-02 NOTE — Progress Notes (Signed)
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 Patient continues to ambulate.  No flatus.  +Burping. Foley removed yesterday.  Active Problems:   Chronic non obstructive atrophic pyelonephritis  Current Facility-Administered Medications  Medication Dose Route Frequency Provider Last Rate Last Admin  . 0.9 %  sodium chloride infusion (Manually program via Guardrails IV Fluids)   Intravenous Once [place] Pugh, CRNA      . acetaminophen (TYLENOL) tablet 650 mg  650 mg Oral Q6H PRN Jacalyn Lefevre D, MD   650 mg at 11/01/19 1852  . amLODipine (NORVASC) tablet 2.5 mg  2.5 mg Oral Daily Jacalyn Lefevre D, MD   2.5 mg at 11/01/19 1014  . canagliflozin (INVOKANA) tablet 100 mg  100 mg Oral QAC breakfast Jacalyn Lefevre D, MD   100 mg at 11/01/19 1013  . Chlorhexidine Gluconate Cloth 2 % PADS 6 each  6 each Topical Daily Robley Fries, MD   6 each at 10/31/19 2204  . dextrose 5 %-0.45 % sodium chloride infusion   Intravenous Continuous Robley Fries, MD 75 mL/hr at 11/01/19 1835 New Bag at 11/01/19 1835  . diphenhydrAMINE (BENADRYL) injection 12.5 mg  12.5 mg Intravenous Q6H PRN Jacalyn Lefevre D, MD       Or  . diphenhydrAMINE (BENADRYL) 12.5 MG/5ML elixir 12.5 mg  12.5 mg Oral Q6H PRN Jacalyn Lefevre D, MD      . docusate sodium (COLACE) capsule 100 mg  100 mg Oral BID Jacalyn Lefevre D, MD   100 mg at 11/01/19 2149  . irbesartan (AVAPRO) tablet 300 mg  300 mg Oral Daily Jacalyn Lefevre D, MD   300 mg at 11/01/19 1013  . lactated ringers infusion   Intravenous Continuous Myrtie Soman, MD   Stopped at 10/31/19 2108  . lip balm (CARMEX) ointment   Topical PRN Jacalyn Lefevre D, MD      . loratadine (CLARITIN) tablet 10 mg  10 mg Oral Daily Jacalyn Lefevre D, MD   10 mg at 11/01/19 1013  . morphine 4 MG/ML injection 2-4 mg  2-4 mg  Intravenous Q2H PRN Jacalyn Lefevre D, MD   2 mg at 11/01/19 1434  . ondansetron (ZOFRAN) injection 4 mg  4 mg Intravenous Q4H PRN Jacalyn Lefevre D, MD      . oxybutynin (DITROPAN) tablet 5 mg  5 mg Oral Q8H PRN Jed Limerick, NP      . oxyCODONE (Oxy IR/ROXICODONE) immediate release tablet 5 mg  5 mg Oral Q4H PRN Jacalyn Lefevre D, MD   5 mg at 11/02/19 0412  . pravastatin (PRAVACHOL) tablet 40 mg  40 mg Oral Daily Jacalyn Lefevre D, MD   40 mg at 11/01/19 1013     Objective: Vital: Vitals:   11/01/19 0430 11/01/19 1254 11/01/19 2031 11/02/19 0407  BP: 123/69 115/79 124/76 (!) 141/68  Pulse: 66 67 71 64  Resp: 17  17 17   Temp: 98.1 F (36.7 C) 97.7 F (36.5 C) 98.3 F (36.8 C) 97.8 F (36.6 C)  TempSrc: Oral Oral Oral Oral  SpO2: 97% 96% 97% 95%  Weight:      Height:       I/Os: I/O last 3 completed shifts: In: 3954.1 [I.V.:3404.1; IV Piggyback:550] Out: 3800 [Urine:3600; Blood:200]  Physical Exam:  General: Patient is in no apparent distress Lungs: Normal respiratory effort, chest expands symmetrically. GI: Incisions  are c/d/i.  The abdomen is soft, appropriately ttp, mildly distended this AM Foley: removed yesterday  Ext: lower extremities symmetric  Lab Results: Recent Labs    10/31/19 1116 11/01/19 0453  WBC  --  11.1*  HGB 10.3* 9.6*  HCT 34.7* 31.5*   Recent Labs    10/31/19 1116 11/01/19 0453  NA 136 138  K 3.9 4.2  CL 103 103  CO2 23 25  GLUCOSE 181* 163*  BUN 11 9  CREATININE 0.91 0.85  CALCIUM 8.8* 9.2   No results for input(s): LABPT, INR in the last 72 hours. No results for input(s): LABURIN in the last 72 hours. Results for orders placed or performed during the hospital encounter of 10/27/19  SARS CORONAVIRUS 2 (TAT 6-24 HRS) Nasopharyngeal Nasopharyngeal Swab     Status: None   Collection Time: 10/27/19  3:07 PM   Specimen: Nasopharyngeal Swab  Result Value Ref Range Status   SARS Coronavirus 2 NEGATIVE NEGATIVE Final    Comment:  (NOTE) SARS-CoV-2 target nucleic acids are NOT DETECTED. The SARS-CoV-2 RNA is generally detectable in upper and lower respiratory specimens during the acute phase of infection. Negative results do not preclude SARS-CoV-2 infection, do not rule out co-infections with other pathogens, and should not be used as the sole basis for treatment or other patient management decisions. Negative results must be combined with clinical observations, patient history, and epidemiological information. The expected result is Negative. Fact Sheet for Patients: SugarRoll.be Fact Sheet for Healthcare Providers: https://www.woods-mathews.com/ This test is not yet approved or cleared by the [place] FDA and  has been authorized for detection and/or diagnosis of SARS-CoV-2 by FDA under an Emergency Use Authorization (EUA). This EUA will remain  in effect (meaning this test can be used) for the duration of the COVID-19 declaration under Section 56 4(b)(1) of the Act, 21 U.S.C. section 360bbb-3(b)(1), unless the authorization is terminated or revoked sooner. Performed at [facility], [place] [address] [place], [place] [address]      Assessment: 69 yo woman with non-functional XGP left kidney POD2 s/p  HAND ASSISTED LAPAROSCOPIC NEPHRECTOMY awaiting return of bowel function.  Hgb and creatinine stable.  Plan: -focus on ambulating today to help with return of bowel function -stay on clears while burping -pain meds -bowel regimen -home meds -SCDs while in bed    Jacalyn Lefevre, MD Urology 11/02/2019, 6:40 AM

## 2019-11-03 ENCOUNTER — Inpatient Hospital Stay (HOSPITAL_COMMUNITY): Payer: PPO

## 2019-11-03 LAB — BPAM RBC
Blood Product Expiration Date: 202104252359
Blood Product Expiration Date: 202104252359
ISSUE DATE / TIME: 202103251344
ISSUE DATE / TIME: 202103251440
Unit Type and Rh: 5100
Unit Type and Rh: 5100

## 2019-11-03 LAB — TYPE AND SCREEN
ABO/RH(D): O POS
Antibody Screen: NEGATIVE
Unit division: 0
Unit division: 0

## 2019-11-03 MED ORDER — ENOXAPARIN SODIUM 40 MG/0.4ML ~~LOC~~ SOLN
40.0000 mg | Freq: Every day | SUBCUTANEOUS | Status: DC
  Administered 2019-11-03 – 2019-11-04 (×2): 40 mg via SUBCUTANEOUS
  Filled 2019-11-03 (×3): qty 0.4

## 2019-11-03 MED ORDER — ENOXAPARIN SODIUM 40 MG/0.4ML ~~LOC~~ SOLN: 40.0000 mg | SUBCUTANEOUS | Status: DC

## 2019-11-03 MED ORDER — OXYCODONE HCL 5 MG PO TABS: 5.0000 mg | ORAL_TABLET | ORAL | 0 refills | Status: AC | PRN

## 2019-11-03 MED ORDER — SENNOSIDES-DOCUSATE SODIUM 8.6-50 MG PO TABS: 2.0000 | ORAL_TABLET | Freq: Every day | ORAL | 1 refills | Status: AC | PRN

## 2019-11-03 NOTE — Discharge Summary (Signed)
Date of admission: 10/31/2019  Date of discharge: 11/05/19  Admission diagnosis: left non-functional XGP kidney  Discharge diagnosis: left non-functional XGP kidney  Secondary diagnoses:  Patient Active Problem List   Diagnosis Date Noted   Chronic non obstructive atrophic pyelonephritis 10/31/2019    Procedures performed: Procedure(s): HAND ASSISTED LAPAROSCOPIC NEPHRECTOMY  History and Physical: For full details, please see admission history and physical.   Hospital Course: Patient tolerated the procedure well.  She was then transferred to the floor after an uneventful PACU stay.  Her hospital course was uncomplicated.  On POD#5she had met discharge criteria: was eating a regular diet, was up and ambulating independently,  pain was well controlled, was voiding without a catheter, and was ready to for discharge.   Laboratory values:  Recent Labs    10/31/19 1116 11/01/19 0453 11/02/19 0713  WBC  --  11.1* 9.1  HGB 10.3* 9.6* 10.0*  HCT 34.7* 31.5* 31.9*   Recent Labs    10/31/19 1116 11/01/19 0453 11/02/19 0713  NA 136 138 139  K 3.9 4.2 4.0  CL 103 103 106  CO2 '23 25 24  '$ GLUCOSE 181* 163* 108*  BUN 11 9 6*  CREATININE 0.91 0.85 0.83  CALCIUM 8.8* 9.2 9.0   No results for input(s): LABPT, INR in the last 72 hours. No results for input(s): LABURIN in the last 72 hours. Results for orders placed or performed during the hospital encounter of 10/27/19  SARS CORONAVIRUS 2 (TAT 6-24 HRS) Nasopharyngeal Nasopharyngeal Swab     Status: None   Collection Time: 10/27/19  3:07 PM   Specimen: Nasopharyngeal Swab  Result Value Ref Range Status   SARS Coronavirus 2 NEGATIVE NEGATIVE Final    Comment: (NOTE) SARS-CoV-2 target nucleic acids are NOT DETECTED. The SARS-CoV-2 RNA is generally detectable in upper and lower respiratory specimens during the acute phase of infection. Negative results do not preclude SARS-CoV-2 infection, do not rule out co-infections with other  pathogens, and should not be used as the sole basis for treatment or other patient management decisions. Negative results must be combined with clinical observations, patient history, and epidemiological information. The expected result is Negative. Fact Sheet for Patients: SugarRoll.be Fact Sheet for Healthcare Providers: https://www.woods-mathews.com/ This test is not yet approved or cleared by the [place] FDA and  has been authorized for detection and/or diagnosis of SARS-CoV-2 by FDA under an Emergency Use Authorization (EUA). This EUA will remain  in effect (meaning this test can be used) for the duration of the COVID-19 declaration under Section 56 4(b)(1) of the Act, 21 U.S.C. section 360bbb-3(b)(1), unless the authorization is terminated or revoked sooner. Performed at [facility], [place] [address], [place], [place] [address]     Disposition: Home  Discharge instruction: The patient was instructed to be ambulatory but told to refrain from heavy lifting, strenuous activity, or driving.  Discharge medications:    Followup:  Follow-up Information     [facility].   Contact information: [place] [phone]

## 2019-11-03 NOTE — Progress Notes (Signed)
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  Patient is without complaint.  No flatus yet.  Continues to burp.  No nausea or vomiting.  Active Problems:   Chronic non obstructive atrophic pyelonephritis  Current Facility-Administered Medications  Medication Dose Route Frequency Provider Last Rate Last Admin  . 0.9 %  sodium chloride infusion (Manually program via Guardrails IV Fluids)   Intravenous Once West Pugh, CRNA      . acetaminophen (TYLENOL) tablet 650 mg  650 mg Oral Q6H PRN Jacalyn Lefevre D, MD   650 mg at 11/01/19 1852  . amLODipine (NORVASC) tablet 2.5 mg  2.5 mg Oral Daily Jacalyn Lefevre D, MD   2.5 mg at 11/03/19 0906  . canagliflozin (INVOKANA) tablet 100 mg  100 mg Oral QAC breakfast Jacalyn Lefevre D, MD   100 mg at 11/03/19 0857  . dextrose 5 %-0.45 % sodium chloride infusion   Intravenous Continuous Jacalyn Lefevre D, MD 75 mL/hr at 11/02/19 0740 New Bag at 11/02/19 0740  . diphenhydrAMINE (BENADRYL) injection 12.5 mg  12.5 mg Intravenous Q6H PRN Jacalyn Lefevre D, MD       Or  . diphenhydrAMINE (BENADRYL) 12.5 MG/5ML elixir 12.5 mg  12.5 mg Oral Q6H PRN Jacalyn Lefevre D, MD      . docusate sodium (COLACE) capsule 100 mg  100 mg Oral BID Jacalyn Lefevre D, MD   100 mg at 11/03/19 0907  . irbesartan (AVAPRO) tablet 300 mg  300 mg Oral Daily Jacalyn Lefevre D, MD   300 mg at 11/03/19 [id number]  . lactated ringers infusion   Intravenous Continuous Myrtie Soman, MD   Stopped at 10/31/19 2108  . lip balm (CARMEX) ointment   Topical PRN Jacalyn Lefevre D, MD      . loratadine (CLARITIN) tablet 10 mg  10 mg Oral Daily Jacalyn Lefevre D, MD   10 mg at 11/03/19 0908  . morphine 4 MG/ML injection 2-4 mg  2-4 mg Intravenous Q2H PRN Jacalyn Lefevre D, MD   2 mg at 11/01/19 1434  . ondansetron (ZOFRAN) injection 4 mg   4 mg Intravenous Q4H PRN Jacalyn Lefevre D, MD      . oxybutynin (DITROPAN) tablet 5 mg  5 mg Oral Q8H PRN Jed Limerick, NP      . oxyCODONE (Oxy IR/ROXICODONE) immediate release tablet 5 mg  5 mg Oral Q4H PRN Jacalyn Lefevre D, MD   5 mg at 11/03/19 0929  . pravastatin (PRAVACHOL) tablet 40 mg  40 mg Oral Daily Jacalyn Lefevre D, MD   40 mg at 11/03/19 0908     Objective: Vital: Vitals:   11/02/19 1335 11/02/19 2257 11/03/19 0515 11/03/19 0841  BP: 134/76 118/67 121/67 126/77  Pulse: 79 76 67 68  Resp:  20 18 18   Temp: 97.7 F (36.5 C) 98.2 F (36.8 C) 98.1 F (36.7 C) 98.1 F (36.7 C)  TempSrc: Oral Oral Oral Oral  SpO2: 100% 96% 95% 99%  Weight:      Height:       I/Os: I/O last 3 completed shifts: In: 2170.1 [P.O.:240; I.V.:1930.1] Out: 3450 [Urine:3450]  Physical Exam:  General: Patient is in no apparent distress Lungs: Normal respiratory effort, chest expands symmetrically. GI: Incisions are c/d/i.  The abdomen is soft, appropriately ttp, non-distended Ext: lower extremities symmetric  Lab Results: Recent Labs    10/31/19  1116 11/01/19 0453 11/02/19 0713  WBC  --  11.1* 9.1  HGB 10.3* 9.6* 10.0*  HCT 34.7* 31.5* 31.9*   Recent Labs    10/31/19 1116 11/01/19 0453 11/02/19 0713  NA 136 138 139  K 3.9 4.2 4.0  CL 103 103 106  CO2 23 25 24   GLUCOSE 181* 163* 108*  BUN 11 9 6*  CREATININE 0.91 0.85 0.83  CALCIUM 8.8* 9.2 9.0   No results for input(s): LABPT, INR in the last 72 hours.  Studies/Results: No results found.  Assessment: 69 yo woman with non-functional left XGP kidney POD3 s/p  HAND ASSISTED LAPAROSCOPIC NEPHRECTOMY doing well.  Plan: -continue clears until flatus -continue ambulation -start lovenox for DVT ppx, SCDs while in bed -pain meds PRN -bowel regimen -home meds -KUB to assess postop ileus -AM bmp  -d/c planning: will await return of bowel function   Jacalyn Lefevre, MD Urology 11/03/2019, 10:05 AM

## 2019-11-04 ENCOUNTER — Inpatient Hospital Stay (HOSPITAL_COMMUNITY): Payer: PPO

## 2019-11-04 LAB — BASIC METABOLIC PANEL
Anion gap: 9 (ref 5–15)
BUN: <5 mg/dL — ABNORMAL LOW (ref 8–23)
CO2: 27 mmol/L (ref 22–32)
Calcium: 9.3 mg/dL (ref 8.9–10.3)
Chloride: 106 mmol/L (ref 98–111)
Creatinine, Ser: 0.84 mg/dL (ref 0.44–1.00)
GFR calc Af Amer: >60 mL/min (ref >60)
GFR calc non Af Amer: >60 mL/min (ref >60)
Glucose, Bld: 98 mg/dL (ref 70–99)
Potassium: 3.8 mmol/L (ref 3.5–5.1)
Sodium: 142 mmol/L (ref 135–145)

## 2019-11-04 LAB — TROPONIN I (HIGH SENSITIVITY): Troponin I (High Sensitivity): 3 ng/L (ref <18)

## 2019-11-04 MED ORDER — NITROGLYCERIN 0.4 MG SL SUBL
SUBLINGUAL_TABLET | SUBLINGUAL | Status: AC
  Administered 2019-11-04: 02:00:00 0.4 mg
  Filled 2019-11-04: qty 1

## 2019-11-04 MED ORDER — ALUM & MAG HYDROXIDE-SIMETH 200-200-20 MG/5ML PO SUSP
30.0000 mL | Freq: Once | ORAL | Status: AC
  Administered 2019-11-04: 30 mL via ORAL
  Filled 2019-11-04: qty 30

## 2019-11-04 MED ORDER — NITROGLYCERIN 0.4 MG SL SUBL
SUBLINGUAL_TABLET | SUBLINGUAL | Status: AC
  Filled 2019-11-04: qty 1

## 2019-11-04 MED ORDER — BISACODYL 10 MG RE SUPP
10.0000 mg | Freq: Once | RECTAL | Status: AC
  Administered 2019-11-04: 10 mg via RECTAL
  Filled 2019-11-04 (×2): qty 1

## 2019-11-04 MED ORDER — LIDOCAINE VISCOUS HCL 2 % MT SOLN
15.0000 mL | Freq: Once | OROMUCOSAL | Status: AC
  Administered 2019-11-04: 15 mL via ORAL
  Filled 2019-11-04: qty 15

## 2019-11-04 NOTE — Progress Notes (Signed)
Nurse called to room pt. Having 10/10 chest pain. Nurse assess pt. Pain is on the right side under breast. RRT Nurse called, AC called, MD notifed. Vitals taken, EKG taken. Nitroglycerin given.  Morphine given. GI cocktail given. Still no relief . MD notified again. X-Ray of chest and abdomen order. Will continue to monitor.

## 2019-11-04 NOTE — Progress Notes (Signed)
Patient reports that she has passed flatulence and had a small loose BM.  MD made aware.

## 2019-11-04 NOTE — Progress Notes (Signed)
Urology Inpatient Progress Report  Chronic non obstructive atrophic pyelonephritis [N13.729]  Procedure(s): HAND ASSISTED LAPAROSCOPIC NEPHRECTOMY  4 Days Post-Op   Intv/Subj: Early this morning, the patient experienced severe right-sided lower chest pain under her breast.  EKG showed normal sinus rhythm.  Troponins were negative.  Chest x-ray was negative.  KUB showed a stable ileus.  Her creatinine is stable with good urine output.  She is afebrile with vital signs stable.  She continues to have mild pain under her breast on the right but it is improving.  Still no flatus.  She is having a lot of burps.  No vomiting.  Overall, tolerating clears.  Active Problems:   Chronic non obstructive atrophic pyelonephritis  Current Facility-Administered Medications  Medication Dose Route Frequency Provider Last Rate Last Admin  . 0.9 %  sodium chloride infusion (Manually program via Guardrails IV Fluids)   Intravenous Once West Pugh, CRNA      . acetaminophen (TYLENOL) tablet 650 mg  650 mg Oral Q6H PRN Jacalyn Lefevre D, MD   650 mg at 11/03/19 2149  . amLODipine (NORVASC) tablet 2.5 mg  2.5 mg Oral Daily Jacalyn Lefevre D, MD   2.5 mg at 11/04/19 1112  . canagliflozin (INVOKANA) tablet 100 mg  100 mg Oral QAC breakfast Jacalyn Lefevre D, MD   100 mg at 11/04/19 1111  . dextrose 5 %-0.45 % sodium chloride infusion   Intravenous Continuous Robley Fries, MD 75 mL/hr at 11/04/19 0705 New Bag at 11/04/19 0705  . diphenhydrAMINE (BENADRYL) injection 12.5 mg  12.5 mg Intravenous Q6H PRN Jacalyn Lefevre D, MD       Or  . diphenhydrAMINE (BENADRYL) 12.5 MG/5ML elixir 12.5 mg  12.5 mg Oral Q6H PRN Jacalyn Lefevre D, MD      . docusate sodium (COLACE) capsule 100 mg  100 mg Oral BID Jacalyn Lefevre D, MD   100 mg at 11/04/19 1111  . enoxaparin (LOVENOX) injection 40 mg  40 mg Subcutaneous Daily Jacalyn Lefevre D, MD   40 mg at 11/04/19 1112  . irbesartan (AVAPRO) tablet 300 mg  300 mg Oral Daily  Jacalyn Lefevre D, MD   300 mg at 11/04/19 1111  . lactated ringers infusion   Intravenous Continuous Myrtie Soman, MD   Stopped at 10/31/19 2108  . lip balm (CARMEX) ointment   Topical PRN Jacalyn Lefevre D, MD      . loratadine (CLARITIN) tablet 10 mg  10 mg Oral Daily Jacalyn Lefevre D, MD   10 mg at 11/04/19 1111  . morphine 4 MG/ML injection 2-4 mg  2-4 mg Intravenous Q2H PRN Jacalyn Lefevre D, MD   4 mg at 11/04/19 0228  . ondansetron (ZOFRAN) injection 4 mg  4 mg Intravenous Q4H PRN Jacalyn Lefevre D, MD      . oxybutynin (DITROPAN) tablet 5 mg  5 mg Oral Q8H PRN Jed Limerick, NP      . oxyCODONE (Oxy IR/ROXICODONE) immediate release tablet 5 mg  5 mg Oral Q4H PRN Jacalyn Lefevre D, MD   5 mg at 11/04/19 1116  . pravastatin (PRAVACHOL) tablet 40 mg  40 mg Oral Daily Jacalyn Lefevre D, MD   40 mg at 11/04/19 1111     Objective: Vital: Vitals:   11/03/19 2126 11/04/19 0218 11/04/19 0222 11/04/19 0750  BP: 128/70 (!) 157/95 136/86 136/66  Pulse: 68 93 93 70  Resp: 20 16 16 20   Temp: 98.1 F (36.7 C)   98.3 F (  36.8 C)  TempSrc: Oral   Oral  SpO2: 99% 98% 99% 93%  Weight:      Height:       I/Os: I/O last 3 completed shifts: In: 54 [P.O.:960; I.V.:750] Out: 4500 [Urine:4500]  Physical Exam:  General: Patient is in no apparent distress Lungs: Normal respiratory effort, chest expands symmetrically. GI: Incisions are c/d/i. The abdomen is soft and appropriately tender without mass. Ext: lower extremities symmetric  Lab Results: Recent Labs    11/02/19 0713  WBC 9.1  HGB 10.0*  HCT 31.9*   Recent Labs    11/02/19 0713 11/04/19 0534  NA 139 142  K 4.0 3.8  CL 106 106  CO2 24 27  GLUCOSE 108* 98  BUN 6* <5*  CREATININE 0.83 0.84  CALCIUM 9.0 9.3   No results for input(s): LABPT, INR in the last 72 hours. No results for input(s): LABURIN in the last 72 hours. Results for orders placed or performed during the hospital encounter of 10/27/19  SARS  CORONAVIRUS 2 (TAT 6-24 HRS) Nasopharyngeal Nasopharyngeal Swab     Status: None   Collection Time: 10/27/19  3:07 PM   Specimen: Nasopharyngeal Swab  Result Value Ref Range Status   SARS Coronavirus 2 NEGATIVE NEGATIVE Final    Comment: (NOTE) SARS-CoV-2 target nucleic acids are NOT DETECTED. The SARS-CoV-2 RNA is generally detectable in upper and lower respiratory specimens during the acute phase of infection. Negative results do not preclude SARS-CoV-2 infection, do not rule out co-infections with other pathogens, and should not be used as the sole basis for treatment or other patient management decisions. Negative results must be combined with clinical observations, patient history, and epidemiological information. The expected result is Negative. Fact Sheet for Patients: SugarRoll.be Fact Sheet for Healthcare Providers: https://www.woods-mathews.com/ This test is not yet approved or cleared by the [place] FDA and  has been authorized for detection and/or diagnosis of SARS-CoV-2 by FDA under an Emergency Use Authorization (EUA). This EUA will remain  in effect (meaning this test can be used) for the duration of the COVID-19 declaration under Section 56 4(b)(1) of the Act, 21 U.S.C. section 360bbb-3(b)(1), unless the authorization is terminated or revoked sooner. Performed at [facility], [place] [address], [place], [place] [address]     Studies/Results: DG Abd 1 View  Result Date: 11/03/2019 CLINICAL DATA:  Postoperative ileus.  Recent nephrectomy. EXAM: ABDOMEN - 1 VIEW COMPARISON:  CT abdomen pelvis dated September 29, 2019. FINDINGS: There are few borderline dilated loops of air-filled small bowel in the central abdomen. Nondistended colon containing air, with a small amount of stool in the cecum. No radio-opaque calculi or other significant radiographic abnormality are seen. Small amount of subcutaneous air in the right  greater than left lateral abdominal wall, likely postsurgical. IMPRESSION: 1. Very mild small bowel ileus. Electronically Signed   By: Titus Dubin M.D.   On: 11/03/2019 11:05   DG ABD ACUTE 2+V W 1V CHEST  Result Date: 11/04/2019 CLINICAL DATA:  A soft ileus EXAM: DG ABDOMEN ACUTE W/ 1V CHEST COMPARISON:  None. FINDINGS: Gaseous distension of small bowel and colon similar in appearance to the prior examination of 11/03/2018 consistent with persistent ileus. No radiopaque calculi or other significant radiographic abnormality is seen. Heart size and mediastinal contours are within normal limits. Both lungs are clear. IMPRESSION: Persistent small-bowel ileus. No acute cardiopulmonary disease. Electronically Signed   By: Kathreen Devoid   On: 11/04/2019 06:40    Assessment:  Left XGP kidney, nonfunctional  Procedure(s): HAND ASSISTED LAPAROSCOPIC NEPHRECTOMY, 4 Days Post-Op  doing well.  Plan: Continue to ambulate Continue clear liquid diet until return of bowel function. Continue Lovenox for DVT prophylaxis SCDs, incentive spirometry   Link Snuffer, MD Urology 11/04/2019, 11:39 AM

## 2019-11-05 NOTE — Discharge Summary (Signed)
Physician Discharge Summary  Patient ID: Kristy Montgomery MRN: [id number] DOB/AGE: February 15, 1951 69 y.o.  Admit date: 10/31/2019 Discharge date: 11/05/2019  Admission Diagnoses:  Discharge Diagnoses:  Active Problems:   Chronic non obstructive atrophic pyelonephritis   Discharged Condition: good  Hospital Course: Patient underwent a left hand-assisted laparoscopic simple nephrectomy.  She tolerated the procedure well and was stable postoperative.  She had slow return of bowel function and a mild ileus which kept her in the hospital.  She finally had return of bowel function on 11/04/2019.  Diet was advanced and she tolerated this well.  By this point, pain was controlled and she was tolerating ambulation.  Of note, early on 11/04/2019 the patient had some right-sided chest pain.  Troponins were negative.  Chest x-ray was negative.  Pain is consistent more with musculoskeletal pain.  By the time of discharge, she was stable and doing well.  Consults: None  Significant Diagnostic Studies: As above  Treatments: surgery: As above  Discharge Exam: Blood pressure 126/82, pulse 91, temperature 99.1 F (37.3 C), temperature source Oral, resp. rate 18, height 5' (1.524 m), weight 67.5 kg, SpO2 97 %. General appearance: alert no acute distress Adequate perfusion of extremities Abdomen soft, nontender, nondistended, incisions clean dry and intact   Disposition: Discharge disposition: 01-Home or Self Care        Allergies as of 11/05/2019      Reactions   Metformin And Related Diarrhea   Naproxen Nausea And Vomiting      Medication List    TAKE these medications   acetaminophen 500 MG tablet Commonly known as: TYLENOL Take 1,000 mg by mouth every 6 (six) hours as needed for moderate pain or headache.   amLODipine 2.5 MG tablet Commonly known as: NORVASC Take 2.5 mg by mouth daily.   cetirizine 10 MG tablet Commonly known as: ZYRTEC Take 10 mg by mouth daily as needed for  allergies.   Fish Oil 1200 MG Caps Take 2,400 mg by mouth in the morning and at bedtime.   Jardiance 10 MG Tabs tablet Generic drug: empagliflozin Take 10 mg by mouth daily.   oxyCODONE 5 MG immediate release tablet Commonly known as: Roxicodone Take 1 tablet (5 mg total) by mouth every 4 (four) hours as needed for severe pain.   pravastatin 40 MG tablet Commonly known as: PRAVACHOL Take 40 mg by mouth daily.   REFRESH OP Place 1 drop into both eyes daily as needed (dry eyes).   senna-docusate 8.6-50 MG tablet Commonly known as: Senokot-S Take 2 tablets by mouth daily as needed for mild constipation.   valsartan 320 MG tablet Commonly known as: DIOVAN Take 320 mg by mouth daily.   Vitamin D 50 MCG (2000 UT) tablet Take 2,000 Units by mouth daily.      Follow-up Information    [facility].   Contact information: [place] [phone]          Signed: Marton Redwood, III 11/05/2019, 10:25 AM

## 2019-11-05 NOTE — Plan of Care (Signed)
  Problem: Clinical Measurements: Goal: Ability to maintain clinical measurements within normal limits will improve Outcome: Adequate for Discharge Goal: Postoperative complications will be avoided or minimized Outcome: Adequate for Discharge   Problem: Clinical Measurements: Goal: Ability to maintain clinical measurements within normal limits will improve Outcome: Completed/Met   Problem: Activity: Goal: Risk for activity intolerance will decrease Outcome: Completed/Met   Problem: Coping: Goal: Level of anxiety will decrease Outcome: Completed/Met   Problem: Pain Managment: Goal: General experience of comfort will improve Outcome: Completed/Met   Problem: Safety: Goal: Ability to remain free from injury will improve Outcome: Completed/Met

## 2019-11-14 DIAGNOSIS — N111 Chronic obstructive pyelonephritis: Secondary | ICD-10-CM | POA: Diagnosis not present

## 2019-11-14 DIAGNOSIS — R8279 Other abnormal findings on microbiological examination of urine: Secondary | ICD-10-CM | POA: Diagnosis not present

## 2019-11-15 ENCOUNTER — Ambulatory Visit: Payer: PPO | Attending: Internal Medicine

## 2019-11-15 DIAGNOSIS — Z23 Encounter for immunization: Secondary | ICD-10-CM

## 2019-11-15 DIAGNOSIS — N10 Acute tubulo-interstitial nephritis: Secondary | ICD-10-CM | POA: Diagnosis not present

## 2019-11-15 NOTE — Progress Notes (Signed)
   Covid-19 Vaccination Clinic  Name:  Kristy Montgomery    MRN: [id number] DOB: 17-Jan-1951  11/15/2019  [name] was observed post Covid-19 immunization for 15 minutes without incident. She was provided with Vaccine Information Sheet and instruction to access the V-Safe system.   [name] was instructed to call 911 with any severe reactions post vaccine: Marland Kitchen Difficulty breathing  . Swelling of face and throat  . A fast heartbeat  . A bad rash all over body  . Dizziness and weakness   Immunizations Administered    Name Date Dose VIS Date Route   Pfizer COVID-19 Vaccine 11/15/2019  1:39 PM 0.3 mL 07/21/2019 Intramuscular   Manufacturer: [place]   Lot: [id number]   [place]: [id number]

## 2019-11-17 DIAGNOSIS — N111 Chronic obstructive pyelonephritis: Secondary | ICD-10-CM | POA: Diagnosis not present

## 2019-11-23 DIAGNOSIS — N111 Chronic obstructive pyelonephritis: Secondary | ICD-10-CM | POA: Diagnosis not present

## 2019-12-11 DIAGNOSIS — N111 Chronic obstructive pyelonephritis: Secondary | ICD-10-CM | POA: Diagnosis not present

## 2019-12-11 DIAGNOSIS — N10 Acute tubulo-interstitial nephritis: Secondary | ICD-10-CM | POA: Diagnosis not present

## 2019-12-12 DIAGNOSIS — K5730 Diverticulosis of large intestine without perforation or abscess without bleeding: Secondary | ICD-10-CM | POA: Diagnosis not present

## 2019-12-12 DIAGNOSIS — N111 Chronic obstructive pyelonephritis: Secondary | ICD-10-CM | POA: Diagnosis not present

## 2019-12-26 DIAGNOSIS — N111 Chronic obstructive pyelonephritis: Secondary | ICD-10-CM | POA: Diagnosis not present

## 2020-01-02 DIAGNOSIS — I129 Hypertensive chronic kidney disease with stage 1 through stage 4 chronic kidney disease, or unspecified chronic kidney disease: Secondary | ICD-10-CM | POA: Diagnosis not present

## 2020-01-02 DIAGNOSIS — N2581 Secondary hyperparathyroidism of renal origin: Secondary | ICD-10-CM | POA: Diagnosis not present

## 2020-01-11 DIAGNOSIS — E1122 Type 2 diabetes mellitus with diabetic chronic kidney disease: Secondary | ICD-10-CM | POA: Diagnosis not present

## 2020-01-11 DIAGNOSIS — E785 Hyperlipidemia, unspecified: Secondary | ICD-10-CM | POA: Diagnosis not present

## 2020-01-11 DIAGNOSIS — N2581 Secondary hyperparathyroidism of renal origin: Secondary | ICD-10-CM | POA: Diagnosis not present

## 2020-01-11 DIAGNOSIS — Z905 Acquired absence of kidney: Secondary | ICD-10-CM | POA: Diagnosis not present

## 2020-01-11 DIAGNOSIS — N183 Chronic kidney disease, stage 3 unspecified: Secondary | ICD-10-CM | POA: Diagnosis not present

## 2020-01-11 DIAGNOSIS — I129 Hypertensive chronic kidney disease with stage 1 through stage 4 chronic kidney disease, or unspecified chronic kidney disease: Secondary | ICD-10-CM | POA: Diagnosis not present

## 2020-01-11 DIAGNOSIS — R809 Proteinuria, unspecified: Secondary | ICD-10-CM | POA: Diagnosis not present

## 2020-01-17 DIAGNOSIS — H35371 Puckering of macula, right eye: Secondary | ICD-10-CM | POA: Diagnosis not present

## 2020-01-17 DIAGNOSIS — H401132 Primary open-angle glaucoma, bilateral, moderate stage: Secondary | ICD-10-CM | POA: Diagnosis not present

## 2020-01-17 DIAGNOSIS — H04123 Dry eye syndrome of bilateral lacrimal glands: Secondary | ICD-10-CM | POA: Diagnosis not present

## 2020-01-22 DIAGNOSIS — R8271 Bacteriuria: Secondary | ICD-10-CM | POA: Diagnosis not present

## 2020-01-22 DIAGNOSIS — T8130XA Disruption of wound, unspecified, initial encounter: Secondary | ICD-10-CM | POA: Diagnosis not present

## 2020-02-01 DIAGNOSIS — T8130XA Disruption of wound, unspecified, initial encounter: Secondary | ICD-10-CM | POA: Diagnosis not present

## 2020-02-05 DIAGNOSIS — E559 Vitamin D deficiency, unspecified: Secondary | ICD-10-CM | POA: Diagnosis not present

## 2020-02-05 DIAGNOSIS — N181 Chronic kidney disease, stage 1: Secondary | ICD-10-CM | POA: Diagnosis not present

## 2020-02-05 DIAGNOSIS — E1121 Type 2 diabetes mellitus with diabetic nephropathy: Secondary | ICD-10-CM | POA: Diagnosis not present

## 2020-02-05 DIAGNOSIS — I129 Hypertensive chronic kidney disease with stage 1 through stage 4 chronic kidney disease, or unspecified chronic kidney disease: Secondary | ICD-10-CM | POA: Diagnosis not present

## 2020-02-05 DIAGNOSIS — Z23 Encounter for immunization: Secondary | ICD-10-CM | POA: Diagnosis not present

## 2020-02-05 DIAGNOSIS — E785 Hyperlipidemia, unspecified: Secondary | ICD-10-CM | POA: Diagnosis not present

## 2020-03-28 DIAGNOSIS — N111 Chronic obstructive pyelonephritis: Secondary | ICD-10-CM | POA: Diagnosis not present

## 2020-04-24 DIAGNOSIS — G5601 Carpal tunnel syndrome, right upper limb: Secondary | ICD-10-CM | POA: Diagnosis not present

## 2020-04-24 DIAGNOSIS — B356 Tinea cruris: Secondary | ICD-10-CM | POA: Diagnosis not present

## 2020-05-02 IMAGING — MG DIGITAL SCREENING BILATERAL MAMMOGRAM WITH CAD
4 series · 4 of 4 positions shown · non-contrast
Comparison: Previous exam(s).

CLINICAL DATA: Screening.

EXAM:
DIGITAL SCREENING BILATERAL MAMMOGRAM WITH CAD

[R CC]
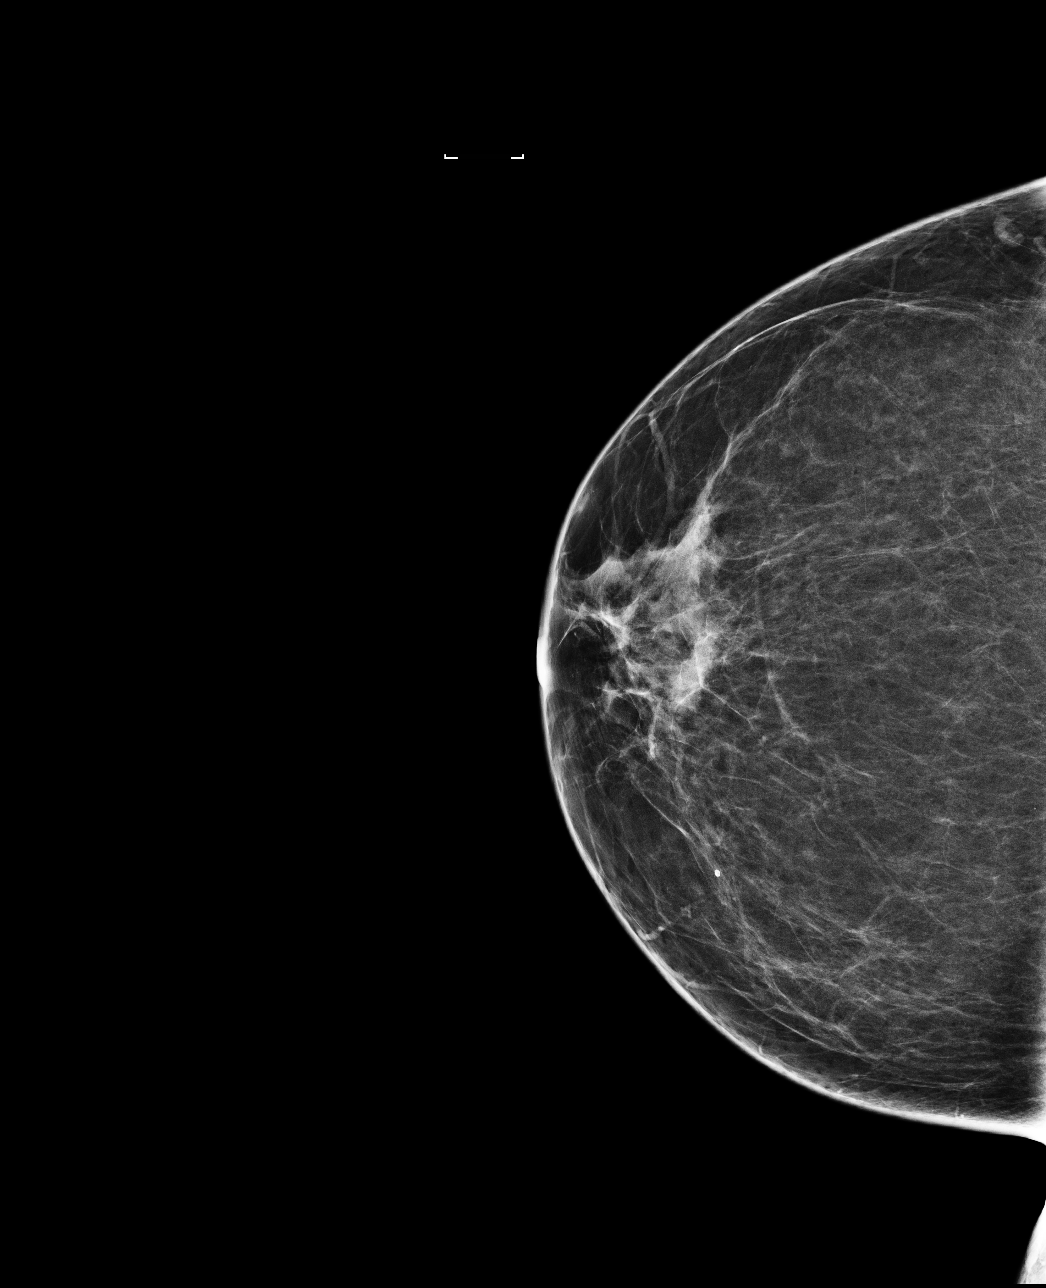

[R MLO]
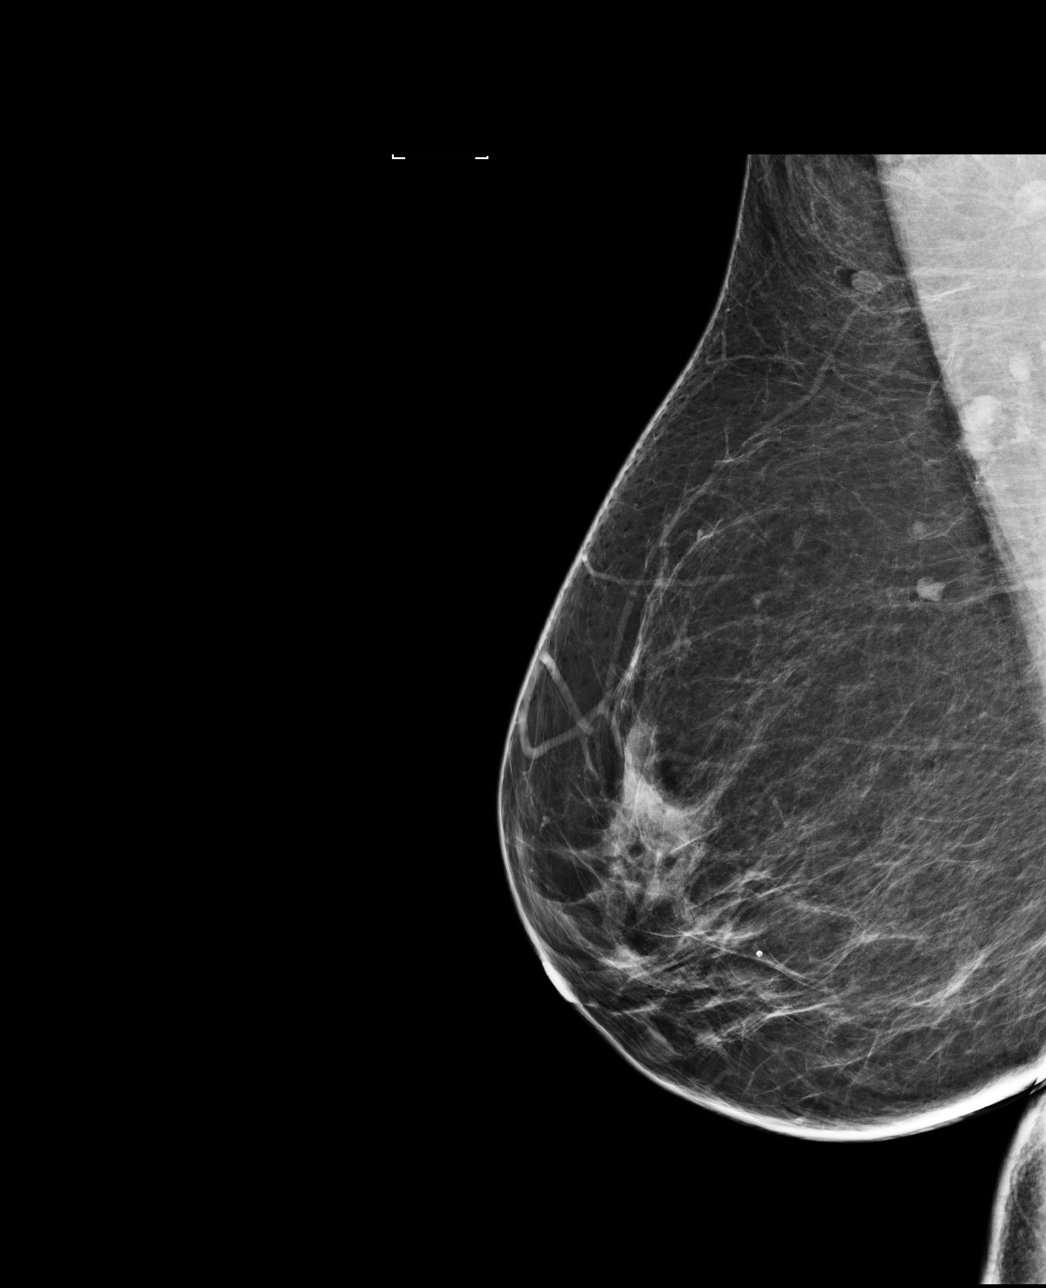

[L MLO]
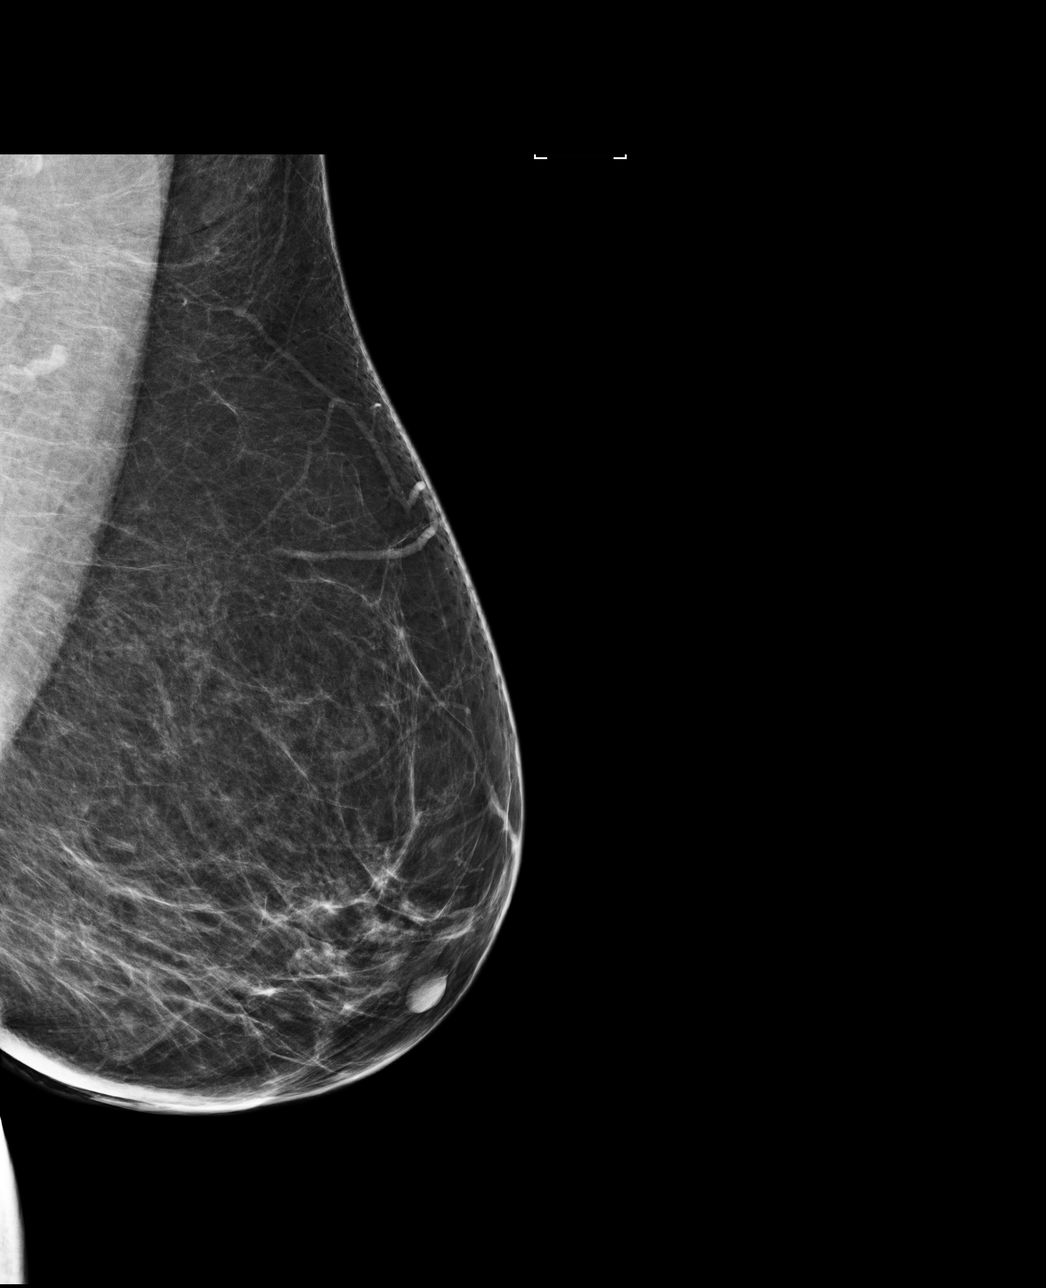

[L CC]
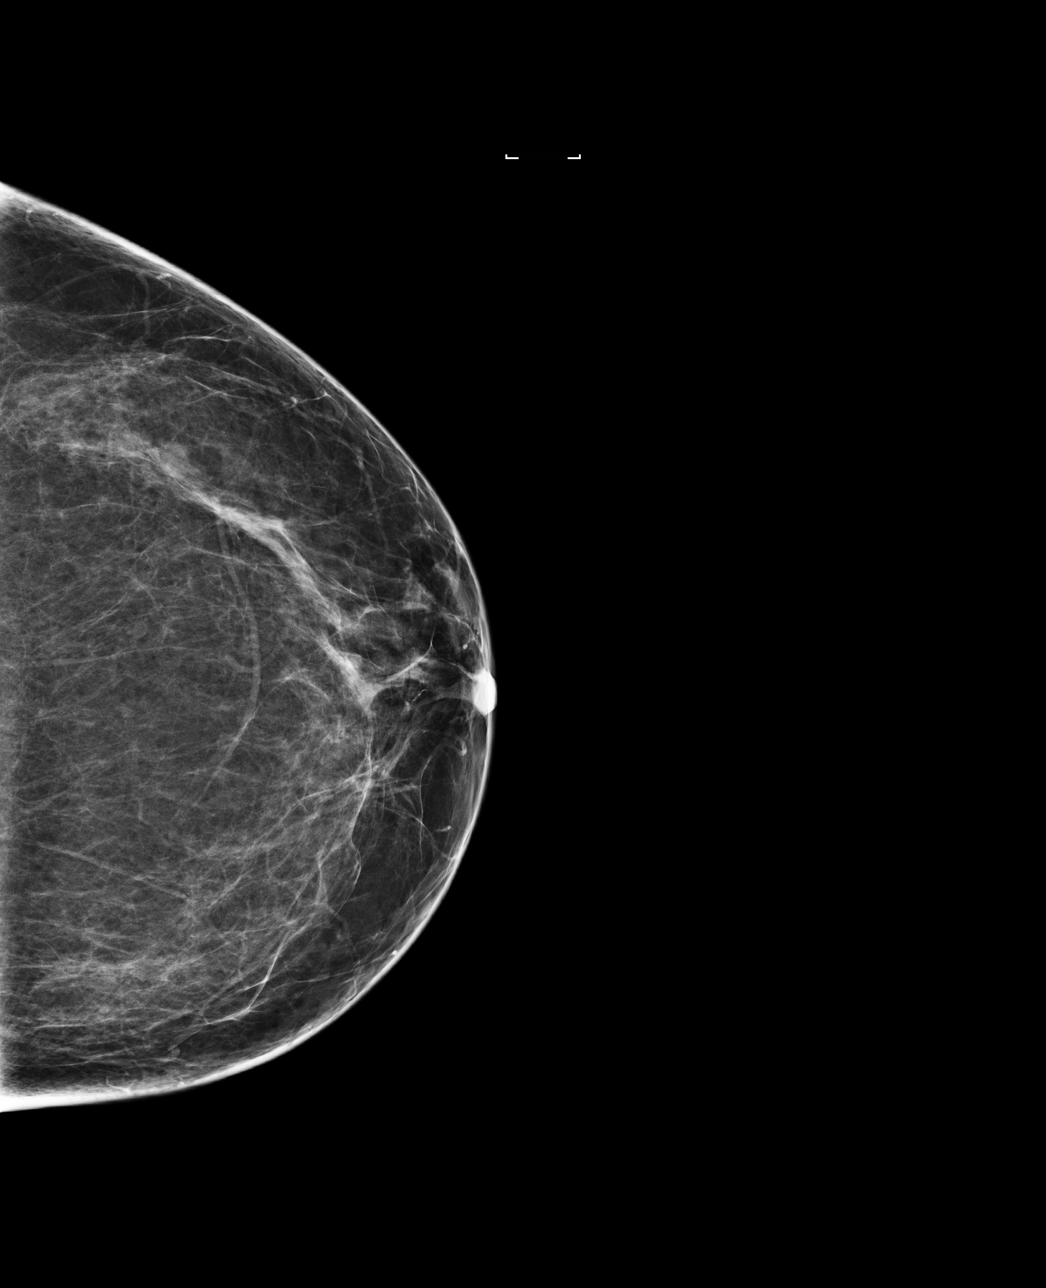

[4 of 4 positions shown; findings below may reference images not displayed]

ACR Breast Density Category b: There are scattered areas of
fibroglandular density.
FINDINGS: There are no findings suspicious for malignancy. Images were
processed with CAD.
IMPRESSION: No mammographic evidence of malignancy. A result letter of this
screening mammogram will be mailed directly to the patient.

RECOMMENDATION:
Screening mammogram in one year. (Code:AS-G-LCT)

BI-RADS CATEGORY  1: Negative.

## 2020-05-09 DIAGNOSIS — H401132 Primary open-angle glaucoma, bilateral, moderate stage: Secondary | ICD-10-CM | POA: Diagnosis not present

## 2020-05-16 DIAGNOSIS — E785 Hyperlipidemia, unspecified: Secondary | ICD-10-CM | POA: Diagnosis not present

## 2020-05-16 DIAGNOSIS — E1121 Type 2 diabetes mellitus with diabetic nephropathy: Secondary | ICD-10-CM | POA: Diagnosis not present

## 2020-05-16 DIAGNOSIS — N181 Chronic kidney disease, stage 1: Secondary | ICD-10-CM | POA: Diagnosis not present

## 2020-05-16 DIAGNOSIS — I129 Hypertensive chronic kidney disease with stage 1 through stage 4 chronic kidney disease, or unspecified chronic kidney disease: Secondary | ICD-10-CM | POA: Diagnosis not present

## 2020-06-05 DIAGNOSIS — E1121 Type 2 diabetes mellitus with diabetic nephropathy: Secondary | ICD-10-CM | POA: Diagnosis not present

## 2020-06-05 DIAGNOSIS — I129 Hypertensive chronic kidney disease with stage 1 through stage 4 chronic kidney disease, or unspecified chronic kidney disease: Secondary | ICD-10-CM | POA: Diagnosis not present

## 2020-06-05 DIAGNOSIS — E785 Hyperlipidemia, unspecified: Secondary | ICD-10-CM | POA: Diagnosis not present

## 2020-06-05 DIAGNOSIS — G5601 Carpal tunnel syndrome, right upper limb: Secondary | ICD-10-CM | POA: Diagnosis not present

## 2020-06-05 DIAGNOSIS — Z23 Encounter for immunization: Secondary | ICD-10-CM | POA: Diagnosis not present

## 2020-06-05 DIAGNOSIS — N181 Chronic kidney disease, stage 1: Secondary | ICD-10-CM | POA: Diagnosis not present

## 2020-06-05 DIAGNOSIS — E559 Vitamin D deficiency, unspecified: Secondary | ICD-10-CM | POA: Diagnosis not present

## 2020-06-05 DIAGNOSIS — M25561 Pain in right knee: Secondary | ICD-10-CM | POA: Diagnosis not present

## 2020-06-05 DIAGNOSIS — M25562 Pain in left knee: Secondary | ICD-10-CM | POA: Diagnosis not present

## 2020-07-10 DIAGNOSIS — E785 Hyperlipidemia, unspecified: Secondary | ICD-10-CM | POA: Diagnosis not present

## 2020-07-10 DIAGNOSIS — I129 Hypertensive chronic kidney disease with stage 1 through stage 4 chronic kidney disease, or unspecified chronic kidney disease: Secondary | ICD-10-CM | POA: Diagnosis not present

## 2020-07-10 DIAGNOSIS — N181 Chronic kidney disease, stage 1: Secondary | ICD-10-CM | POA: Diagnosis not present

## 2020-07-10 DIAGNOSIS — E1121 Type 2 diabetes mellitus with diabetic nephropathy: Secondary | ICD-10-CM | POA: Diagnosis not present

## 2020-07-10 DIAGNOSIS — G5601 Carpal tunnel syndrome, right upper limb: Secondary | ICD-10-CM | POA: Diagnosis not present

## 2020-07-10 DIAGNOSIS — Z0000 Encounter for general adult medical examination without abnormal findings: Secondary | ICD-10-CM | POA: Diagnosis not present

## 2020-07-10 DIAGNOSIS — E559 Vitamin D deficiency, unspecified: Secondary | ICD-10-CM | POA: Diagnosis not present

## 2020-07-10 DIAGNOSIS — E669 Obesity, unspecified: Secondary | ICD-10-CM | POA: Diagnosis not present

## 2020-07-10 DIAGNOSIS — F17200 Nicotine dependence, unspecified, uncomplicated: Secondary | ICD-10-CM | POA: Diagnosis not present

## 2020-09-06 DIAGNOSIS — E1121 Type 2 diabetes mellitus with diabetic nephropathy: Secondary | ICD-10-CM | POA: Diagnosis not present

## 2020-09-06 DIAGNOSIS — E785 Hyperlipidemia, unspecified: Secondary | ICD-10-CM | POA: Diagnosis not present

## 2020-09-06 DIAGNOSIS — I129 Hypertensive chronic kidney disease with stage 1 through stage 4 chronic kidney disease, or unspecified chronic kidney disease: Secondary | ICD-10-CM | POA: Diagnosis not present

## 2020-09-06 DIAGNOSIS — N181 Chronic kidney disease, stage 1: Secondary | ICD-10-CM | POA: Diagnosis not present

## 2020-09-10 ENCOUNTER — Other Ambulatory Visit: Payer: Self-pay | Admitting: Family Medicine

## 2020-09-10 DIAGNOSIS — N183 Chronic kidney disease, stage 3 unspecified: Secondary | ICD-10-CM | POA: Diagnosis not present

## 2020-09-10 DIAGNOSIS — Z1231 Encounter for screening mammogram for malignant neoplasm of breast: Secondary | ICD-10-CM

## 2020-09-18 DIAGNOSIS — H401132 Primary open-angle glaucoma, bilateral, moderate stage: Secondary | ICD-10-CM | POA: Diagnosis not present

## 2020-09-19 DIAGNOSIS — I129 Hypertensive chronic kidney disease with stage 1 through stage 4 chronic kidney disease, or unspecified chronic kidney disease: Secondary | ICD-10-CM | POA: Diagnosis not present

## 2020-09-19 DIAGNOSIS — N2581 Secondary hyperparathyroidism of renal origin: Secondary | ICD-10-CM | POA: Diagnosis not present

## 2020-09-19 DIAGNOSIS — N182 Chronic kidney disease, stage 2 (mild): Secondary | ICD-10-CM | POA: Diagnosis not present

## 2020-10-24 ENCOUNTER — Ambulatory Visit: Payer: PPO

## 2020-10-27 DIAGNOSIS — Z20822 Contact with and (suspected) exposure to covid-19: Secondary | ICD-10-CM | POA: Diagnosis not present

## 2020-11-26 DIAGNOSIS — K1329 Other disturbances of oral epithelium, including tongue: Secondary | ICD-10-CM | POA: Diagnosis not present

## 2020-12-04 DIAGNOSIS — D165 Benign neoplasm of lower jaw bone: Secondary | ICD-10-CM | POA: Diagnosis not present

## 2020-12-12 ENCOUNTER — Ambulatory Visit
Admission: RE | Admit: 2020-12-12 | Discharge: 2020-12-12 | Disposition: A | Discharge status: Home / Self Care | Payer: PPO | Source: Ambulatory Visit | Attending: Family Medicine | Admitting: Family Medicine

## 2020-12-12 ENCOUNTER — Other Ambulatory Visit: Payer: Self-pay | Admitting: Family Medicine

## 2020-12-12 ENCOUNTER — Other Ambulatory Visit: Payer: Self-pay

## 2020-12-12 DIAGNOSIS — Z1231 Encounter for screening mammogram for malignant neoplasm of breast: Secondary | ICD-10-CM

## 2020-12-13 DIAGNOSIS — I129 Hypertensive chronic kidney disease with stage 1 through stage 4 chronic kidney disease, or unspecified chronic kidney disease: Secondary | ICD-10-CM | POA: Diagnosis not present

## 2020-12-13 DIAGNOSIS — E785 Hyperlipidemia, unspecified: Secondary | ICD-10-CM | POA: Diagnosis not present

## 2020-12-13 DIAGNOSIS — N181 Chronic kidney disease, stage 1: Secondary | ICD-10-CM | POA: Diagnosis not present

## 2020-12-13 DIAGNOSIS — E1121 Type 2 diabetes mellitus with diabetic nephropathy: Secondary | ICD-10-CM | POA: Diagnosis not present

## 2020-12-25 DIAGNOSIS — D165 Benign neoplasm of lower jaw bone: Secondary | ICD-10-CM | POA: Diagnosis not present

## 2020-12-30 DIAGNOSIS — D165 Benign neoplasm of lower jaw bone: Secondary | ICD-10-CM | POA: Diagnosis not present

## 2021-01-08 DIAGNOSIS — E1121 Type 2 diabetes mellitus with diabetic nephropathy: Secondary | ICD-10-CM | POA: Diagnosis not present

## 2021-01-08 DIAGNOSIS — Z7984 Long term (current) use of oral hypoglycemic drugs: Secondary | ICD-10-CM | POA: Diagnosis not present

## 2021-01-08 DIAGNOSIS — E785 Hyperlipidemia, unspecified: Secondary | ICD-10-CM | POA: Diagnosis not present

## 2021-01-08 DIAGNOSIS — E559 Vitamin D deficiency, unspecified: Secondary | ICD-10-CM | POA: Diagnosis not present

## 2021-01-08 DIAGNOSIS — I129 Hypertensive chronic kidney disease with stage 1 through stage 4 chronic kidney disease, or unspecified chronic kidney disease: Secondary | ICD-10-CM | POA: Diagnosis not present

## 2021-01-08 DIAGNOSIS — N181 Chronic kidney disease, stage 1: Secondary | ICD-10-CM | POA: Diagnosis not present

## 2021-02-19 DIAGNOSIS — H401132 Primary open-angle glaucoma, bilateral, moderate stage: Secondary | ICD-10-CM | POA: Diagnosis not present

## 2021-04-04 DIAGNOSIS — E1121 Type 2 diabetes mellitus with diabetic nephropathy: Secondary | ICD-10-CM | POA: Diagnosis not present

## 2021-04-04 DIAGNOSIS — N181 Chronic kidney disease, stage 1: Secondary | ICD-10-CM | POA: Diagnosis not present

## 2021-04-04 DIAGNOSIS — I129 Hypertensive chronic kidney disease with stage 1 through stage 4 chronic kidney disease, or unspecified chronic kidney disease: Secondary | ICD-10-CM | POA: Diagnosis not present

## 2021-04-04 DIAGNOSIS — E785 Hyperlipidemia, unspecified: Secondary | ICD-10-CM | POA: Diagnosis not present

## 2021-05-05 IMAGING — MG DIGITAL SCREENING BILAT W/ CAD
4 series · 4 of 4 positions shown · non-contrast
Comparison: Previous exam(s).

CLINICAL DATA: Screening.

EXAM:
DIGITAL SCREENING BILATERAL MAMMOGRAM WITH CAD

[L CC]
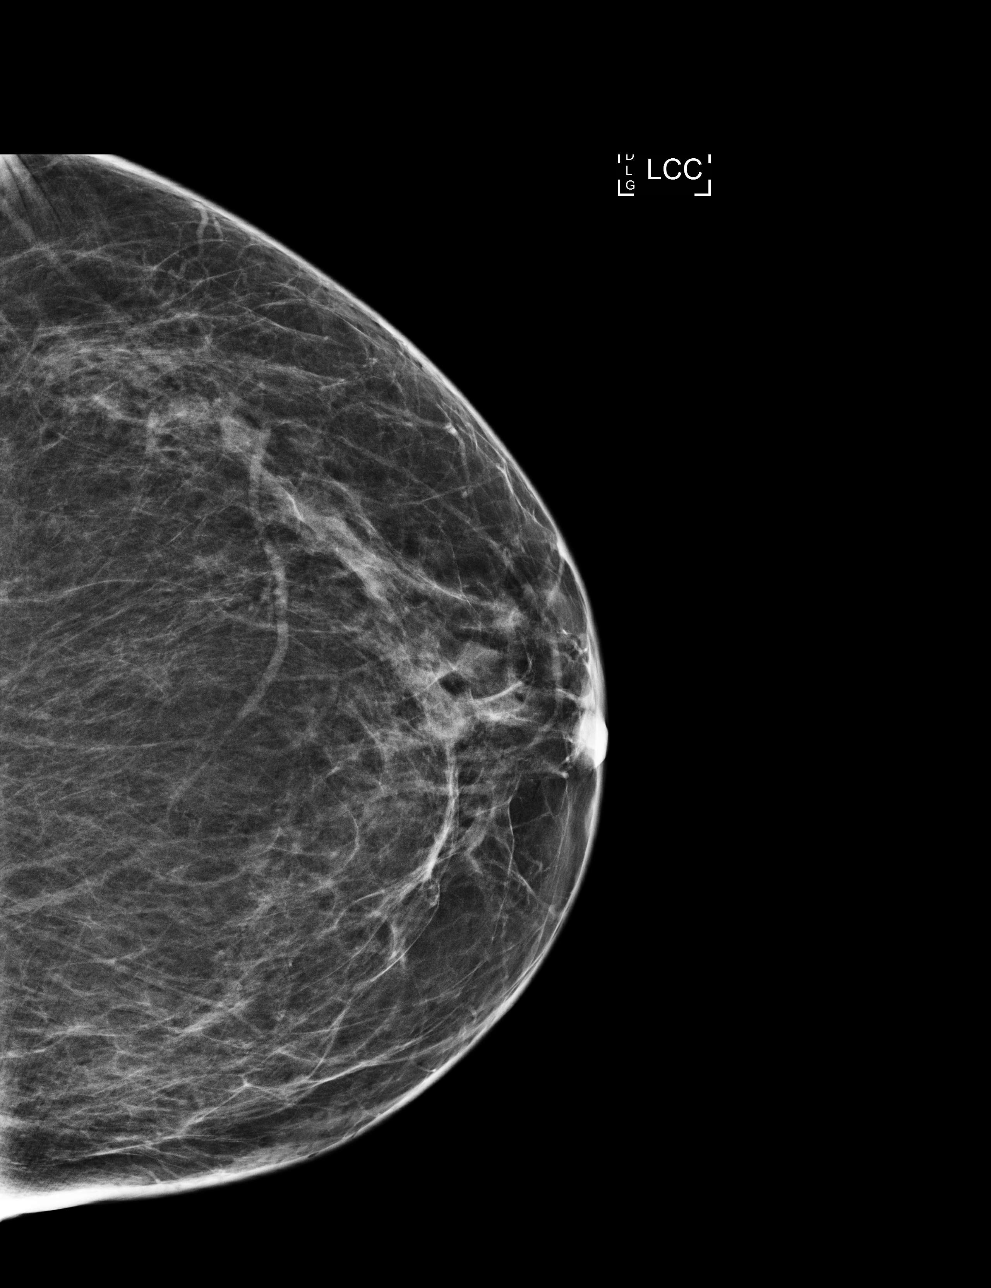

[R MLO]
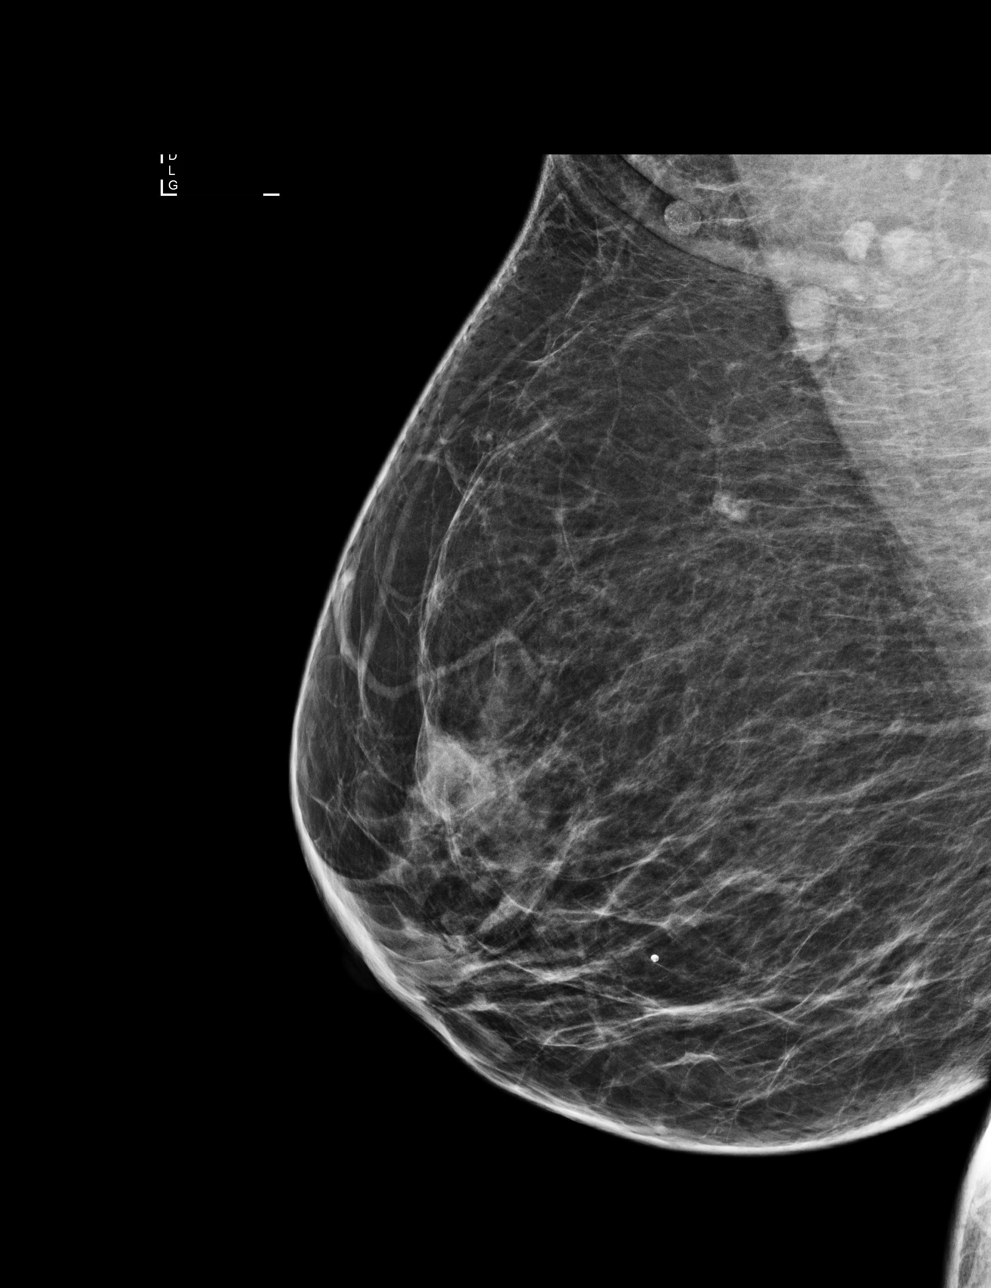

[L MLO]
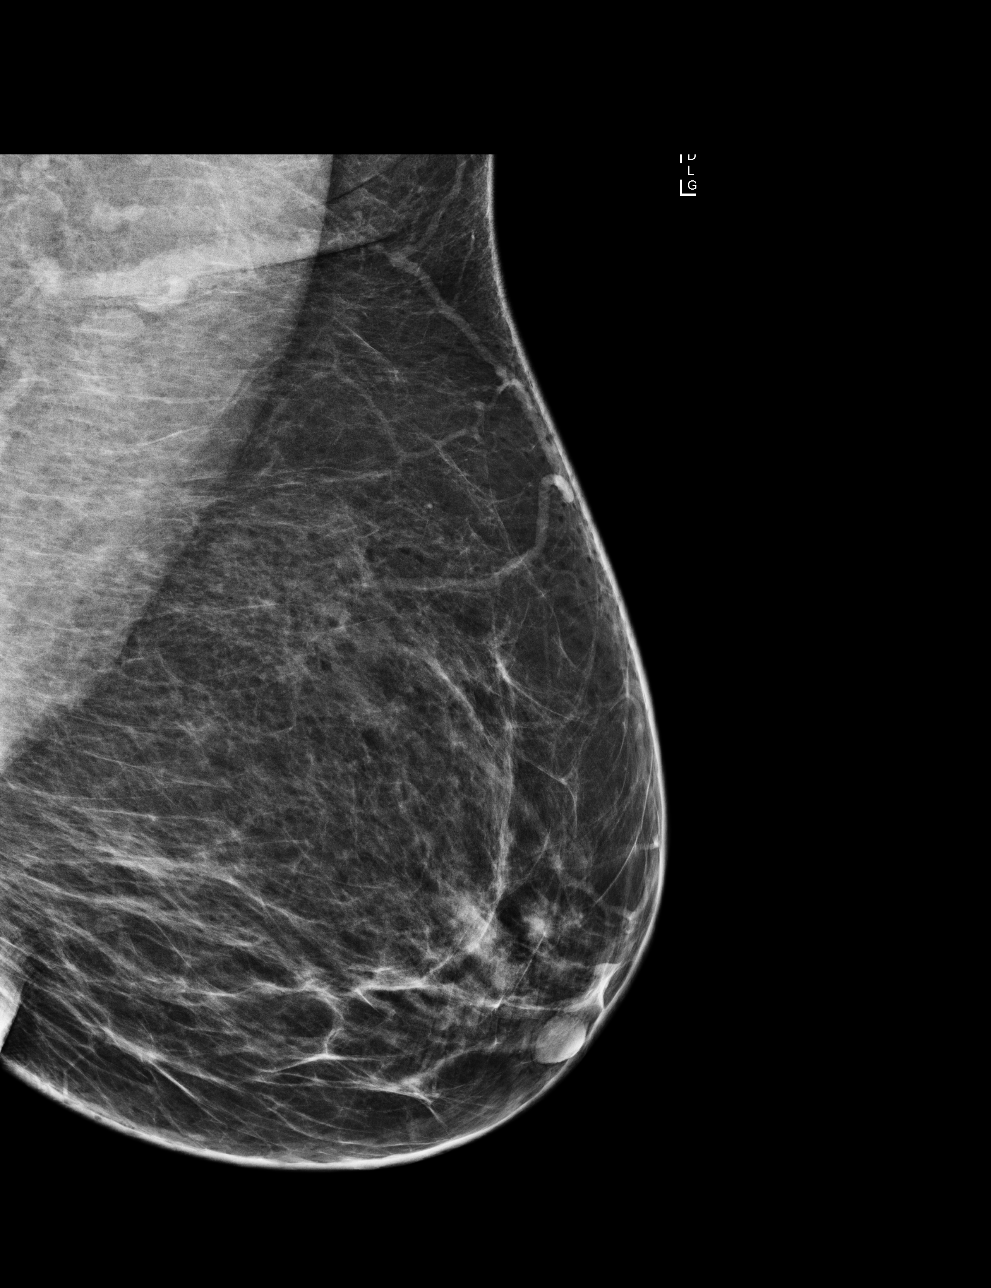

[R CC]
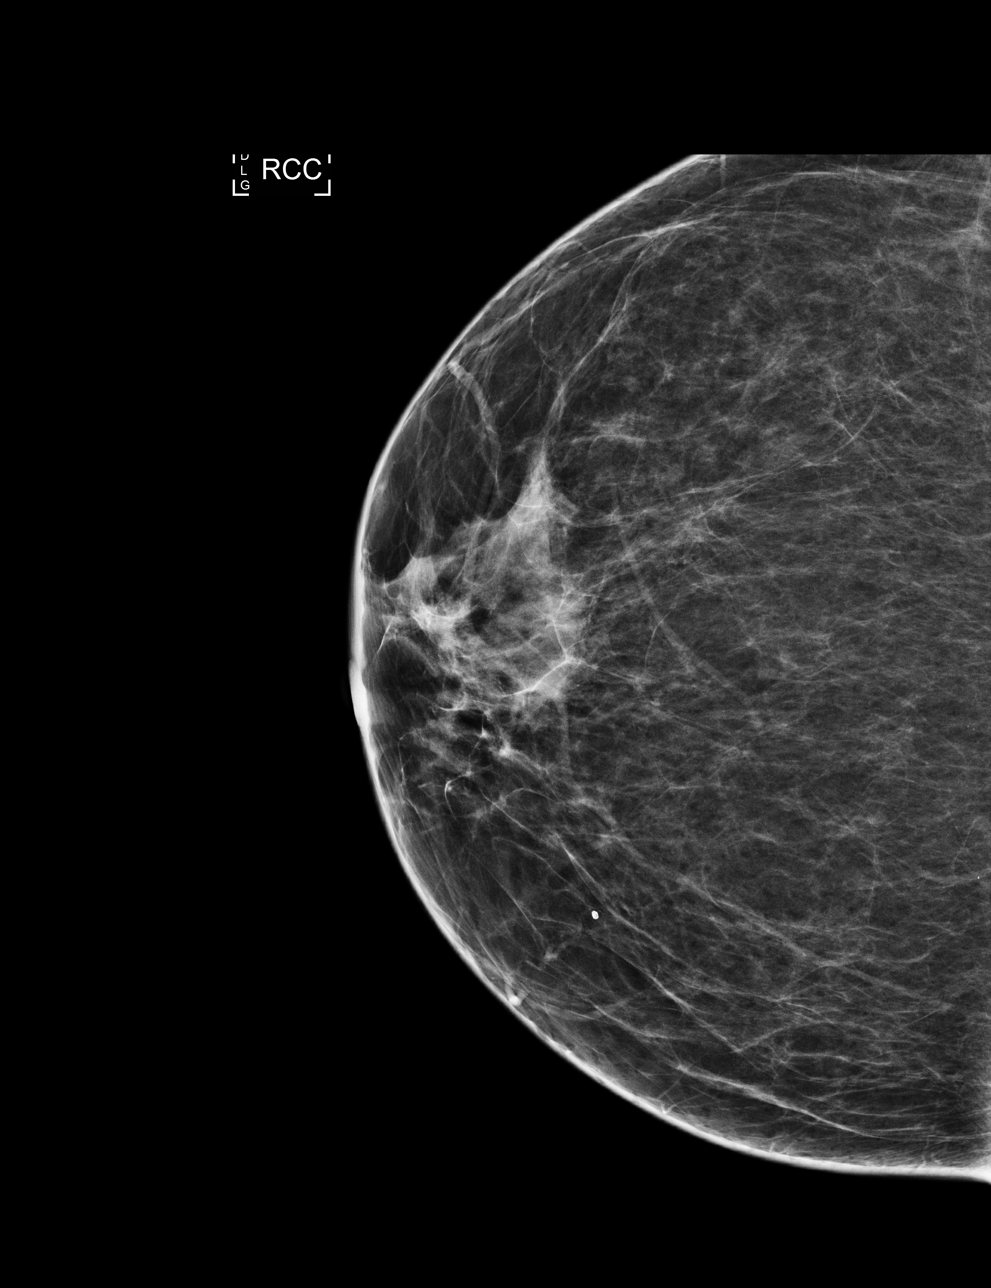

[4 of 4 positions shown; findings below may reference images not displayed]

ACR Breast Density Category b: There are scattered areas of
fibroglandular density.
FINDINGS: There are no findings suspicious for malignancy. Images were
processed with CAD.
IMPRESSION: No mammographic evidence of malignancy. A result letter of this
screening mammogram will be mailed directly to the patient.

RECOMMENDATION:
Screening mammogram in one year. (Code:AS-G-LCT)

BI-RADS CATEGORY  1: Negative.

## 2021-05-29 DIAGNOSIS — Z23 Encounter for immunization: Secondary | ICD-10-CM | POA: Diagnosis not present

## 2021-06-12 DIAGNOSIS — D165 Benign neoplasm of lower jaw bone: Secondary | ICD-10-CM | POA: Diagnosis not present

## 2021-06-16 DIAGNOSIS — K029 Dental caries, unspecified: Secondary | ICD-10-CM | POA: Diagnosis not present

## 2021-06-16 DIAGNOSIS — E785 Hyperlipidemia, unspecified: Secondary | ICD-10-CM | POA: Diagnosis not present

## 2021-06-16 DIAGNOSIS — I10 Essential (primary) hypertension: Secondary | ICD-10-CM | POA: Diagnosis not present

## 2021-06-16 DIAGNOSIS — D165 Benign neoplasm of lower jaw bone: Secondary | ICD-10-CM | POA: Diagnosis not present

## 2021-06-16 DIAGNOSIS — Z7984 Long term (current) use of oral hypoglycemic drugs: Secondary | ICD-10-CM | POA: Diagnosis not present

## 2021-06-16 DIAGNOSIS — Z79899 Other long term (current) drug therapy: Secondary | ICD-10-CM | POA: Diagnosis not present

## 2021-06-16 DIAGNOSIS — F17210 Nicotine dependence, cigarettes, uncomplicated: Secondary | ICD-10-CM | POA: Diagnosis not present

## 2021-06-16 DIAGNOSIS — E119 Type 2 diabetes mellitus without complications: Secondary | ICD-10-CM | POA: Diagnosis not present

## 2021-06-24 DIAGNOSIS — I129 Hypertensive chronic kidney disease with stage 1 through stage 4 chronic kidney disease, or unspecified chronic kidney disease: Secondary | ICD-10-CM | POA: Diagnosis not present

## 2021-06-24 DIAGNOSIS — N181 Chronic kidney disease, stage 1: Secondary | ICD-10-CM | POA: Diagnosis not present

## 2021-06-24 DIAGNOSIS — E785 Hyperlipidemia, unspecified: Secondary | ICD-10-CM | POA: Diagnosis not present

## 2021-06-24 DIAGNOSIS — E1121 Type 2 diabetes mellitus with diabetic nephropathy: Secondary | ICD-10-CM | POA: Diagnosis not present

## 2021-07-09 DIAGNOSIS — N181 Chronic kidney disease, stage 1: Secondary | ICD-10-CM | POA: Diagnosis not present

## 2021-07-09 DIAGNOSIS — I129 Hypertensive chronic kidney disease with stage 1 through stage 4 chronic kidney disease, or unspecified chronic kidney disease: Secondary | ICD-10-CM | POA: Diagnosis not present

## 2021-07-09 DIAGNOSIS — E785 Hyperlipidemia, unspecified: Secondary | ICD-10-CM | POA: Diagnosis not present

## 2021-07-09 DIAGNOSIS — E1121 Type 2 diabetes mellitus with diabetic nephropathy: Secondary | ICD-10-CM | POA: Diagnosis not present

## 2021-07-09 DIAGNOSIS — Z7984 Long term (current) use of oral hypoglycemic drugs: Secondary | ICD-10-CM | POA: Diagnosis not present

## 2021-07-16 DIAGNOSIS — E785 Hyperlipidemia, unspecified: Secondary | ICD-10-CM | POA: Diagnosis not present

## 2021-07-16 DIAGNOSIS — I129 Hypertensive chronic kidney disease with stage 1 through stage 4 chronic kidney disease, or unspecified chronic kidney disease: Secondary | ICD-10-CM | POA: Diagnosis not present

## 2021-07-16 DIAGNOSIS — E1121 Type 2 diabetes mellitus with diabetic nephropathy: Secondary | ICD-10-CM | POA: Diagnosis not present

## 2021-07-16 DIAGNOSIS — H401132 Primary open-angle glaucoma, bilateral, moderate stage: Secondary | ICD-10-CM | POA: Diagnosis not present

## 2021-07-16 DIAGNOSIS — N181 Chronic kidney disease, stage 1: Secondary | ICD-10-CM | POA: Diagnosis not present

## 2021-09-01 DIAGNOSIS — I129 Hypertensive chronic kidney disease with stage 1 through stage 4 chronic kidney disease, or unspecified chronic kidney disease: Secondary | ICD-10-CM | POA: Diagnosis not present

## 2021-09-01 DIAGNOSIS — E1121 Type 2 diabetes mellitus with diabetic nephropathy: Secondary | ICD-10-CM | POA: Diagnosis not present

## 2021-09-01 DIAGNOSIS — N181 Chronic kidney disease, stage 1: Secondary | ICD-10-CM | POA: Diagnosis not present

## 2021-09-01 DIAGNOSIS — E785 Hyperlipidemia, unspecified: Secondary | ICD-10-CM | POA: Diagnosis not present

## 2021-09-24 DIAGNOSIS — N181 Chronic kidney disease, stage 1: Secondary | ICD-10-CM | POA: Diagnosis not present

## 2021-09-24 DIAGNOSIS — I129 Hypertensive chronic kidney disease with stage 1 through stage 4 chronic kidney disease, or unspecified chronic kidney disease: Secondary | ICD-10-CM | POA: Diagnosis not present

## 2021-09-24 DIAGNOSIS — E785 Hyperlipidemia, unspecified: Secondary | ICD-10-CM | POA: Diagnosis not present

## 2021-09-24 DIAGNOSIS — E1121 Type 2 diabetes mellitus with diabetic nephropathy: Secondary | ICD-10-CM | POA: Diagnosis not present

## 2021-10-01 DIAGNOSIS — N182 Chronic kidney disease, stage 2 (mild): Secondary | ICD-10-CM | POA: Diagnosis not present

## 2021-10-01 DIAGNOSIS — N2581 Secondary hyperparathyroidism of renal origin: Secondary | ICD-10-CM | POA: Diagnosis not present

## 2021-10-03 DIAGNOSIS — B3731 Acute candidiasis of vulva and vagina: Secondary | ICD-10-CM | POA: Diagnosis not present

## 2021-10-03 DIAGNOSIS — N181 Chronic kidney disease, stage 1: Secondary | ICD-10-CM | POA: Diagnosis not present

## 2021-10-03 DIAGNOSIS — F17200 Nicotine dependence, unspecified, uncomplicated: Secondary | ICD-10-CM | POA: Diagnosis not present

## 2021-10-03 DIAGNOSIS — Z7984 Long term (current) use of oral hypoglycemic drugs: Secondary | ICD-10-CM | POA: Diagnosis not present

## 2021-10-03 DIAGNOSIS — E785 Hyperlipidemia, unspecified: Secondary | ICD-10-CM | POA: Diagnosis not present

## 2021-10-03 DIAGNOSIS — Z0000 Encounter for general adult medical examination without abnormal findings: Secondary | ICD-10-CM | POA: Diagnosis not present

## 2021-10-03 DIAGNOSIS — E2839 Other primary ovarian failure: Secondary | ICD-10-CM | POA: Diagnosis not present

## 2021-10-03 DIAGNOSIS — I129 Hypertensive chronic kidney disease with stage 1 through stage 4 chronic kidney disease, or unspecified chronic kidney disease: Secondary | ICD-10-CM | POA: Diagnosis not present

## 2021-10-03 DIAGNOSIS — E1121 Type 2 diabetes mellitus with diabetic nephropathy: Secondary | ICD-10-CM | POA: Diagnosis not present

## 2021-10-03 DIAGNOSIS — E559 Vitamin D deficiency, unspecified: Secondary | ICD-10-CM | POA: Diagnosis not present

## 2021-10-06 ENCOUNTER — Other Ambulatory Visit: Payer: Self-pay | Admitting: Family Medicine

## 2021-10-06 DIAGNOSIS — E2839 Other primary ovarian failure: Secondary | ICD-10-CM

## 2021-10-08 DIAGNOSIS — N2581 Secondary hyperparathyroidism of renal origin: Secondary | ICD-10-CM | POA: Diagnosis not present

## 2021-10-08 DIAGNOSIS — I129 Hypertensive chronic kidney disease with stage 1 through stage 4 chronic kidney disease, or unspecified chronic kidney disease: Secondary | ICD-10-CM | POA: Diagnosis not present

## 2021-10-08 DIAGNOSIS — N390 Urinary tract infection, site not specified: Secondary | ICD-10-CM | POA: Diagnosis not present

## 2021-10-08 DIAGNOSIS — N182 Chronic kidney disease, stage 2 (mild): Secondary | ICD-10-CM | POA: Diagnosis not present

## 2021-11-05 ENCOUNTER — Other Ambulatory Visit: Payer: Self-pay | Admitting: Family Medicine

## 2021-11-05 DIAGNOSIS — Z1231 Encounter for screening mammogram for malignant neoplasm of breast: Secondary | ICD-10-CM

## 2021-12-15 ENCOUNTER — Ambulatory Visit: Payer: PPO

## 2021-12-16 ENCOUNTER — Ambulatory Visit
Admission: RE | Admit: 2021-12-16 | Discharge: 2021-12-16 | Disposition: A | Discharge status: Home / Self Care | Payer: PPO | Source: Ambulatory Visit | Attending: Family Medicine | Admitting: Family Medicine

## 2021-12-16 DIAGNOSIS — Z1231 Encounter for screening mammogram for malignant neoplasm of breast: Secondary | ICD-10-CM | POA: Diagnosis not present

## 2022-02-24 DIAGNOSIS — I10 Essential (primary) hypertension: Secondary | ICD-10-CM | POA: Diagnosis not present

## 2022-02-24 DIAGNOSIS — E1122 Type 2 diabetes mellitus with diabetic chronic kidney disease: Secondary | ICD-10-CM | POA: Diagnosis not present

## 2022-02-24 DIAGNOSIS — J309 Allergic rhinitis, unspecified: Secondary | ICD-10-CM | POA: Diagnosis not present

## 2022-02-24 DIAGNOSIS — G8929 Other chronic pain: Secondary | ICD-10-CM | POA: Diagnosis not present

## 2022-02-24 DIAGNOSIS — N183 Chronic kidney disease, stage 3 unspecified: Secondary | ICD-10-CM | POA: Diagnosis not present

## 2022-02-24 DIAGNOSIS — I129 Hypertensive chronic kidney disease with stage 1 through stage 4 chronic kidney disease, or unspecified chronic kidney disease: Secondary | ICD-10-CM | POA: Diagnosis not present

## 2022-02-24 DIAGNOSIS — E559 Vitamin D deficiency, unspecified: Secondary | ICD-10-CM | POA: Diagnosis not present

## 2022-02-24 DIAGNOSIS — E1169 Type 2 diabetes mellitus with other specified complication: Secondary | ICD-10-CM | POA: Diagnosis not present

## 2022-02-24 DIAGNOSIS — E785 Hyperlipidemia, unspecified: Secondary | ICD-10-CM | POA: Diagnosis not present

## 2022-02-24 DIAGNOSIS — F17210 Nicotine dependence, cigarettes, uncomplicated: Secondary | ICD-10-CM | POA: Diagnosis not present

## 2022-03-17 DIAGNOSIS — E785 Hyperlipidemia, unspecified: Secondary | ICD-10-CM | POA: Diagnosis not present

## 2022-03-17 DIAGNOSIS — N181 Chronic kidney disease, stage 1: Secondary | ICD-10-CM | POA: Diagnosis not present

## 2022-03-17 DIAGNOSIS — E1121 Type 2 diabetes mellitus with diabetic nephropathy: Secondary | ICD-10-CM | POA: Diagnosis not present

## 2022-03-17 DIAGNOSIS — I129 Hypertensive chronic kidney disease with stage 1 through stage 4 chronic kidney disease, or unspecified chronic kidney disease: Secondary | ICD-10-CM | POA: Diagnosis not present

## 2022-03-19 ENCOUNTER — Ambulatory Visit
Admission: RE | Admit: 2022-03-19 | Discharge: 2022-03-19 | Disposition: A | Discharge status: Home / Self Care | Payer: PPO | Source: Ambulatory Visit | Attending: Family Medicine | Admitting: Family Medicine

## 2022-03-19 DIAGNOSIS — Z780 Asymptomatic menopausal state: Secondary | ICD-10-CM | POA: Diagnosis not present

## 2022-03-19 DIAGNOSIS — E2839 Other primary ovarian failure: Secondary | ICD-10-CM

## 2022-04-14 DIAGNOSIS — F17200 Nicotine dependence, unspecified, uncomplicated: Secondary | ICD-10-CM | POA: Diagnosis not present

## 2022-04-14 DIAGNOSIS — E1121 Type 2 diabetes mellitus with diabetic nephropathy: Secondary | ICD-10-CM | POA: Diagnosis not present

## 2022-04-14 DIAGNOSIS — I129 Hypertensive chronic kidney disease with stage 1 through stage 4 chronic kidney disease, or unspecified chronic kidney disease: Secondary | ICD-10-CM | POA: Diagnosis not present

## 2022-04-14 DIAGNOSIS — E785 Hyperlipidemia, unspecified: Secondary | ICD-10-CM | POA: Diagnosis not present

## 2022-04-14 DIAGNOSIS — Z23 Encounter for immunization: Secondary | ICD-10-CM | POA: Diagnosis not present

## 2022-04-14 DIAGNOSIS — N181 Chronic kidney disease, stage 1: Secondary | ICD-10-CM | POA: Diagnosis not present

## 2022-06-10 DIAGNOSIS — E1121 Type 2 diabetes mellitus with diabetic nephropathy: Secondary | ICD-10-CM | POA: Diagnosis not present

## 2022-06-10 DIAGNOSIS — E785 Hyperlipidemia, unspecified: Secondary | ICD-10-CM | POA: Diagnosis not present

## 2022-06-10 DIAGNOSIS — N181 Chronic kidney disease, stage 1: Secondary | ICD-10-CM | POA: Diagnosis not present

## 2022-06-10 DIAGNOSIS — I129 Hypertensive chronic kidney disease with stage 1 through stage 4 chronic kidney disease, or unspecified chronic kidney disease: Secondary | ICD-10-CM | POA: Diagnosis not present

## 2022-06-11 DIAGNOSIS — D165 Benign neoplasm of lower jaw bone: Secondary | ICD-10-CM | POA: Diagnosis not present

## 2022-09-14 DIAGNOSIS — H40023 Open angle with borderline findings, high risk, bilateral: Secondary | ICD-10-CM | POA: Diagnosis not present

## 2022-09-14 DIAGNOSIS — E119 Type 2 diabetes mellitus without complications: Secondary | ICD-10-CM | POA: Diagnosis not present

## 2022-09-14 DIAGNOSIS — H04123 Dry eye syndrome of bilateral lacrimal glands: Secondary | ICD-10-CM | POA: Diagnosis not present

## 2022-09-15 DIAGNOSIS — H524 Presbyopia: Secondary | ICD-10-CM | POA: Diagnosis not present

## 2022-10-19 DIAGNOSIS — H35371 Puckering of macula, right eye: Secondary | ICD-10-CM | POA: Diagnosis not present

## 2022-10-19 DIAGNOSIS — E119 Type 2 diabetes mellitus without complications: Secondary | ICD-10-CM | POA: Diagnosis not present

## 2022-10-19 DIAGNOSIS — H40023 Open angle with borderline findings, high risk, bilateral: Secondary | ICD-10-CM | POA: Diagnosis not present

## 2022-10-26 DIAGNOSIS — E1122 Type 2 diabetes mellitus with diabetic chronic kidney disease: Secondary | ICD-10-CM | POA: Diagnosis not present

## 2022-10-26 DIAGNOSIS — E1121 Type 2 diabetes mellitus with diabetic nephropathy: Secondary | ICD-10-CM | POA: Diagnosis not present

## 2022-10-26 DIAGNOSIS — E559 Vitamin D deficiency, unspecified: Secondary | ICD-10-CM | POA: Diagnosis not present

## 2022-10-26 DIAGNOSIS — I129 Hypertensive chronic kidney disease with stage 1 through stage 4 chronic kidney disease, or unspecified chronic kidney disease: Secondary | ICD-10-CM | POA: Diagnosis not present

## 2022-10-26 DIAGNOSIS — E785 Hyperlipidemia, unspecified: Secondary | ICD-10-CM | POA: Diagnosis not present

## 2022-10-26 DIAGNOSIS — N181 Chronic kidney disease, stage 1: Secondary | ICD-10-CM | POA: Diagnosis not present

## 2022-10-26 DIAGNOSIS — F17200 Nicotine dependence, unspecified, uncomplicated: Secondary | ICD-10-CM | POA: Diagnosis not present

## 2022-10-26 DIAGNOSIS — Z0000 Encounter for general adult medical examination without abnormal findings: Secondary | ICD-10-CM | POA: Diagnosis not present

## 2022-10-26 DIAGNOSIS — F439 Reaction to severe stress, unspecified: Secondary | ICD-10-CM | POA: Diagnosis not present

## 2022-10-26 DIAGNOSIS — E669 Obesity, unspecified: Secondary | ICD-10-CM | POA: Diagnosis not present

## 2022-11-04 DIAGNOSIS — E1122 Type 2 diabetes mellitus with diabetic chronic kidney disease: Secondary | ICD-10-CM | POA: Diagnosis not present

## 2022-11-04 DIAGNOSIS — N390 Urinary tract infection, site not specified: Secondary | ICD-10-CM | POA: Diagnosis not present

## 2022-11-04 DIAGNOSIS — E785 Hyperlipidemia, unspecified: Secondary | ICD-10-CM | POA: Diagnosis not present

## 2022-11-04 DIAGNOSIS — I129 Hypertensive chronic kidney disease with stage 1 through stage 4 chronic kidney disease, or unspecified chronic kidney disease: Secondary | ICD-10-CM | POA: Diagnosis not present

## 2022-11-04 DIAGNOSIS — N182 Chronic kidney disease, stage 2 (mild): Secondary | ICD-10-CM | POA: Diagnosis not present

## 2022-11-04 DIAGNOSIS — R809 Proteinuria, unspecified: Secondary | ICD-10-CM | POA: Diagnosis not present

## 2022-11-06 ENCOUNTER — Other Ambulatory Visit: Payer: Self-pay | Admitting: Family Medicine

## 2022-11-06 DIAGNOSIS — Z1231 Encounter for screening mammogram for malignant neoplasm of breast: Secondary | ICD-10-CM

## 2022-11-24 DIAGNOSIS — N182 Chronic kidney disease, stage 2 (mild): Secondary | ICD-10-CM | POA: Diagnosis not present

## 2022-12-14 DIAGNOSIS — H40023 Open angle with borderline findings, high risk, bilateral: Secondary | ICD-10-CM | POA: Diagnosis not present

## 2022-12-22 ENCOUNTER — Ambulatory Visit
Admission: RE | Admit: 2022-12-22 | Discharge: 2022-12-22 | Disposition: A | Discharge status: Home / Self Care | Payer: Medicare HMO | Source: Ambulatory Visit | Attending: Family Medicine | Admitting: Family Medicine

## 2022-12-22 DIAGNOSIS — Z1231 Encounter for screening mammogram for malignant neoplasm of breast: Secondary | ICD-10-CM

## 2023-01-19 DIAGNOSIS — H903 Sensorineural hearing loss, bilateral: Secondary | ICD-10-CM | POA: Diagnosis not present

## 2023-01-26 DIAGNOSIS — N182 Chronic kidney disease, stage 2 (mild): Secondary | ICD-10-CM | POA: Diagnosis not present

## 2023-01-26 DIAGNOSIS — N2581 Secondary hyperparathyroidism of renal origin: Secondary | ICD-10-CM | POA: Diagnosis not present

## 2023-02-03 DIAGNOSIS — N1831 Chronic kidney disease, stage 3a: Secondary | ICD-10-CM | POA: Diagnosis not present

## 2023-02-03 DIAGNOSIS — D631 Anemia in chronic kidney disease: Secondary | ICD-10-CM | POA: Diagnosis not present

## 2023-02-03 DIAGNOSIS — I129 Hypertensive chronic kidney disease with stage 1 through stage 4 chronic kidney disease, or unspecified chronic kidney disease: Secondary | ICD-10-CM | POA: Diagnosis not present

## 2023-02-03 DIAGNOSIS — N2581 Secondary hyperparathyroidism of renal origin: Secondary | ICD-10-CM | POA: Diagnosis not present

## 2023-02-03 DIAGNOSIS — E1122 Type 2 diabetes mellitus with diabetic chronic kidney disease: Secondary | ICD-10-CM | POA: Diagnosis not present

## 2023-02-08 DIAGNOSIS — H903 Sensorineural hearing loss, bilateral: Secondary | ICD-10-CM | POA: Diagnosis not present

## 2023-03-16 DIAGNOSIS — H40023 Open angle with borderline findings, high risk, bilateral: Secondary | ICD-10-CM | POA: Diagnosis not present

## 2023-04-28 DIAGNOSIS — F17200 Nicotine dependence, unspecified, uncomplicated: Secondary | ICD-10-CM | POA: Diagnosis not present

## 2023-04-28 DIAGNOSIS — E1121 Type 2 diabetes mellitus with diabetic nephropathy: Secondary | ICD-10-CM | POA: Diagnosis not present

## 2023-04-28 DIAGNOSIS — Z23 Encounter for immunization: Secondary | ICD-10-CM | POA: Diagnosis not present

## 2023-04-28 DIAGNOSIS — E785 Hyperlipidemia, unspecified: Secondary | ICD-10-CM | POA: Diagnosis not present

## 2023-04-28 DIAGNOSIS — N1831 Chronic kidney disease, stage 3a: Secondary | ICD-10-CM | POA: Diagnosis not present

## 2023-04-28 DIAGNOSIS — I129 Hypertensive chronic kidney disease with stage 1 through stage 4 chronic kidney disease, or unspecified chronic kidney disease: Secondary | ICD-10-CM | POA: Diagnosis not present

## 2023-04-28 DIAGNOSIS — E1122 Type 2 diabetes mellitus with diabetic chronic kidney disease: Secondary | ICD-10-CM | POA: Diagnosis not present

## 2023-04-28 DIAGNOSIS — E669 Obesity, unspecified: Secondary | ICD-10-CM | POA: Diagnosis not present

## 2023-06-17 DIAGNOSIS — Z86018 Personal history of other benign neoplasm: Secondary | ICD-10-CM | POA: Diagnosis not present

## 2023-06-17 DIAGNOSIS — Z09 Encounter for follow-up examination after completed treatment for conditions other than malignant neoplasm: Secondary | ICD-10-CM | POA: Diagnosis not present

## 2023-07-19 DIAGNOSIS — N1831 Chronic kidney disease, stage 3a: Secondary | ICD-10-CM | POA: Diagnosis not present

## 2023-07-29 DIAGNOSIS — D631 Anemia in chronic kidney disease: Secondary | ICD-10-CM | POA: Diagnosis not present

## 2023-07-29 DIAGNOSIS — N189 Chronic kidney disease, unspecified: Secondary | ICD-10-CM | POA: Diagnosis not present

## 2023-07-29 DIAGNOSIS — E1122 Type 2 diabetes mellitus with diabetic chronic kidney disease: Secondary | ICD-10-CM | POA: Diagnosis not present

## 2023-07-29 DIAGNOSIS — N2581 Secondary hyperparathyroidism of renal origin: Secondary | ICD-10-CM | POA: Diagnosis not present

## 2023-07-29 DIAGNOSIS — N1831 Chronic kidney disease, stage 3a: Secondary | ICD-10-CM | POA: Diagnosis not present

## 2023-07-29 DIAGNOSIS — I129 Hypertensive chronic kidney disease with stage 1 through stage 4 chronic kidney disease, or unspecified chronic kidney disease: Secondary | ICD-10-CM | POA: Diagnosis not present

## 2023-08-20 DIAGNOSIS — N1831 Chronic kidney disease, stage 3a: Secondary | ICD-10-CM | POA: Diagnosis not present

## 2023-09-14 DIAGNOSIS — H40023 Open angle with borderline findings, high risk, bilateral: Secondary | ICD-10-CM | POA: Diagnosis not present

## 2023-09-14 DIAGNOSIS — E119 Type 2 diabetes mellitus without complications: Secondary | ICD-10-CM | POA: Diagnosis not present

## 2023-09-14 DIAGNOSIS — H35371 Puckering of macula, right eye: Secondary | ICD-10-CM | POA: Diagnosis not present

## 2023-09-14 DIAGNOSIS — Z9889 Other specified postprocedural states: Secondary | ICD-10-CM | POA: Diagnosis not present

## 2023-11-01 DIAGNOSIS — I129 Hypertensive chronic kidney disease with stage 1 through stage 4 chronic kidney disease, or unspecified chronic kidney disease: Secondary | ICD-10-CM | POA: Diagnosis not present

## 2023-11-01 DIAGNOSIS — R809 Proteinuria, unspecified: Secondary | ICD-10-CM | POA: Diagnosis not present

## 2023-11-01 DIAGNOSIS — D631 Anemia in chronic kidney disease: Secondary | ICD-10-CM | POA: Diagnosis not present

## 2023-11-01 DIAGNOSIS — N1831 Chronic kidney disease, stage 3a: Secondary | ICD-10-CM | POA: Diagnosis not present

## 2023-11-01 DIAGNOSIS — N2581 Secondary hyperparathyroidism of renal origin: Secondary | ICD-10-CM | POA: Diagnosis not present

## 2023-11-01 DIAGNOSIS — E1122 Type 2 diabetes mellitus with diabetic chronic kidney disease: Secondary | ICD-10-CM | POA: Diagnosis not present

## 2023-11-04 DIAGNOSIS — F17210 Nicotine dependence, cigarettes, uncomplicated: Secondary | ICD-10-CM | POA: Diagnosis not present

## 2023-11-04 DIAGNOSIS — E1121 Type 2 diabetes mellitus with diabetic nephropathy: Secondary | ICD-10-CM | POA: Diagnosis not present

## 2023-11-04 DIAGNOSIS — E559 Vitamin D deficiency, unspecified: Secondary | ICD-10-CM | POA: Diagnosis not present

## 2023-11-04 DIAGNOSIS — I129 Hypertensive chronic kidney disease with stage 1 through stage 4 chronic kidney disease, or unspecified chronic kidney disease: Secondary | ICD-10-CM | POA: Diagnosis not present

## 2023-11-04 DIAGNOSIS — N1831 Chronic kidney disease, stage 3a: Secondary | ICD-10-CM | POA: Diagnosis not present

## 2023-11-04 DIAGNOSIS — E785 Hyperlipidemia, unspecified: Secondary | ICD-10-CM | POA: Diagnosis not present

## 2023-11-04 DIAGNOSIS — Z0000 Encounter for general adult medical examination without abnormal findings: Secondary | ICD-10-CM | POA: Diagnosis not present

## 2023-11-15 ENCOUNTER — Other Ambulatory Visit: Payer: Self-pay | Admitting: Family Medicine

## 2023-11-15 DIAGNOSIS — Z1231 Encounter for screening mammogram for malignant neoplasm of breast: Secondary | ICD-10-CM

## 2023-12-24 ENCOUNTER — Ambulatory Visit
Admission: RE | Admit: 2023-12-24 | Discharge: 2023-12-24 | Disposition: A | Discharge status: Home / Self Care | Source: Ambulatory Visit | Attending: Family Medicine | Admitting: Family Medicine

## 2023-12-24 DIAGNOSIS — Z1231 Encounter for screening mammogram for malignant neoplasm of breast: Secondary | ICD-10-CM | POA: Diagnosis not present

## 2024-01-13 DIAGNOSIS — H40023 Open angle with borderline findings, high risk, bilateral: Secondary | ICD-10-CM | POA: Diagnosis not present

## 2024-02-15 DIAGNOSIS — H40023 Open angle with borderline findings, high risk, bilateral: Secondary | ICD-10-CM | POA: Diagnosis not present

## 2024-05-05 DIAGNOSIS — I129 Hypertensive chronic kidney disease with stage 1 through stage 4 chronic kidney disease, or unspecified chronic kidney disease: Secondary | ICD-10-CM | POA: Diagnosis not present

## 2024-05-05 DIAGNOSIS — F17210 Nicotine dependence, cigarettes, uncomplicated: Secondary | ICD-10-CM | POA: Diagnosis not present

## 2024-05-05 DIAGNOSIS — E1121 Type 2 diabetes mellitus with diabetic nephropathy: Secondary | ICD-10-CM | POA: Diagnosis not present

## 2024-05-05 DIAGNOSIS — N1831 Chronic kidney disease, stage 3a: Secondary | ICD-10-CM | POA: Diagnosis not present

## 2024-05-05 DIAGNOSIS — E785 Hyperlipidemia, unspecified: Secondary | ICD-10-CM | POA: Diagnosis not present

## 2024-05-05 DIAGNOSIS — Z23 Encounter for immunization: Secondary | ICD-10-CM | POA: Diagnosis not present

## 2024-05-08 DIAGNOSIS — D631 Anemia in chronic kidney disease: Secondary | ICD-10-CM | POA: Diagnosis not present

## 2024-05-08 DIAGNOSIS — I129 Hypertensive chronic kidney disease with stage 1 through stage 4 chronic kidney disease, or unspecified chronic kidney disease: Secondary | ICD-10-CM | POA: Diagnosis not present

## 2024-05-08 DIAGNOSIS — E1122 Type 2 diabetes mellitus with diabetic chronic kidney disease: Secondary | ICD-10-CM | POA: Diagnosis not present

## 2024-05-08 DIAGNOSIS — N1831 Chronic kidney disease, stage 3a: Secondary | ICD-10-CM | POA: Diagnosis not present

## 2024-05-08 DIAGNOSIS — N2581 Secondary hyperparathyroidism of renal origin: Secondary | ICD-10-CM | POA: Diagnosis not present
